# Patient Record
Sex: Female | Born: 1985 | Race: Black or African American | Hispanic: No | Marital: Single | Smoking: Never smoker
Health system: Southern US, Community
[De-identification: ages and names within clinical notes are randomized; demographics above are authoritative.]

## PROBLEM LIST (undated history)

## (undated) DIAGNOSIS — O24419 Gestational diabetes mellitus in pregnancy, unspecified control: Secondary | ICD-10-CM

## (undated) DIAGNOSIS — G43909 Migraine, unspecified, not intractable, without status migrainosus: Secondary | ICD-10-CM

## (undated) HISTORY — DX: Gestational diabetes mellitus in pregnancy, unspecified control: O24.419

## (undated) HISTORY — PX: WISDOM TOOTH EXTRACTION: SHX21

---
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
## 2007-01-07 ENCOUNTER — Ambulatory Visit (HOSPITAL_COMMUNITY): Admission: RE | Admit: 2007-01-07 | Discharge: 2007-01-07 | Payer: Self-pay | Admitting: Obstetrics & Gynecology

## 2007-05-02 ENCOUNTER — Inpatient Hospital Stay (HOSPITAL_COMMUNITY): Admission: AD | Admit: 2007-05-02 | Discharge: 2007-05-02 | Payer: Self-pay | Admitting: Obstetrics

## 2007-05-03 ENCOUNTER — Encounter: Payer: Self-pay | Admitting: Obstetrics & Gynecology

## 2007-05-03 ENCOUNTER — Inpatient Hospital Stay (HOSPITAL_COMMUNITY): Admission: AD | Admit: 2007-05-03 | Discharge: 2007-05-05 | Payer: Self-pay | Admitting: Obstetrics & Gynecology

## 2007-06-07 ENCOUNTER — Inpatient Hospital Stay (HOSPITAL_COMMUNITY): Admission: AD | Admit: 2007-06-07 | Discharge: 2007-06-07 | Payer: Self-pay | Admitting: Gynecology

## 2008-07-25 ENCOUNTER — Ambulatory Visit (HOSPITAL_COMMUNITY): Admission: RE | Admit: 2008-07-25 | Discharge: 2008-07-25 | Payer: Self-pay | Admitting: Obstetrics & Gynecology

## 2009-03-19 ENCOUNTER — Emergency Department: Payer: Self-pay | Admitting: Emergency Medicine

## 2010-01-27 LAB — HM HIV SCREENING LAB: HM HIV Screening: NEGATIVE

## 2010-06-05 ENCOUNTER — Emergency Department: Payer: Self-pay | Admitting: Emergency Medicine

## 2010-08-27 ENCOUNTER — Emergency Department: Payer: Self-pay | Admitting: Emergency Medicine

## 2010-08-28 ENCOUNTER — Observation Stay: Payer: Self-pay | Admitting: Internal Medicine

## 2010-11-16 ENCOUNTER — Encounter: Payer: Self-pay | Admitting: Obstetrics & Gynecology

## 2011-03-10 NOTE — H&P (Signed)
[name], [name]             ACCOUNT NO.:  [id number]   MEDICAL RECORD NO.:  [id number]          PATIENT TYPE:  INP   LOCATION:  9163                          FACILITY:  [facility]   PHYSICIAN:  Roseanna Rainbow, M.D.DATE OF BIRTH:  June 15, 1986   DATE OF ADMISSION:  05/03/2007  DATE OF DISCHARGE:                              HISTORY & PHYSICAL   CHIEF COMPLAINT:  This patient is a 25 year old gravida 1, para 0 with  an estimated date of confinement for May 31, 2007, with an  intrauterine pregnancy at 36 weeks, complaining of ruptured membranes.   HISTORY OF PRESENT ILLNESS:  Please see the above. Several hours prior  to presentation she reports leakage of clear fluid. She complains of  contractions as well.   ALLERGIES:  NO KNOWN DRUG ALLERGIES.   MEDICATIONS:  Please see the medication reconciliation form.   OB RISK FACTORS:  GBS asymptomatic bacteruria in March 2008.   PRENATAL SCREENINGS:  Chlamydia negative. Urine culture and sensitivity,  please see the above, negative test secure in April of 2008. One-hour  GTT 68, hepatitis B surface antigen negative. Hematocrit 38, hemoglobin  12.9, HIV nonreactive. Pap smear LSIL. Platelets 186,000. Blood type is  A positive, antibody screen negative. RPR nonreactive. Rubella immune.   Most recent ultrasound, 7/7, at 35 weeks 6 days no previa, cephalic  presentation, amniotic fluid index normal. BPP was 8/8.   PAST GYN HISTORY:  Noncontributory.   PAST MEDICAL HISTORY:  No significant history of medical diseases.   PAST SURGICAL HISTORY:  No previous surgery.   SOCIAL HISTORY:  She is a homemaker, single, does not give any  significant history of alcohol usage. Has no significant smoking  history. Denies illicit drug use.   FAMILY HISTORY:  She denies.   PHYSICAL EXAMINATION:  VITAL SIGNS: On physical exam vital signs are  stable, afebrile. Fetal heart tracing baseline 160, is reassuring.  GU: Tocodynamometer, irregular  uterine contractions. Sterile speculum  exam per the RN, she is fern and Nitrazine positive. Her cervix is 1 cm  dilated.   ASSESSMENT:  Intrauterine pregnancy at 36 weeks with early labor, group  B streptococcus  positive, borderline fetal sinus tachycardia.   PLAN:  1. Admission.  2. GBS prophylaxis.  3. Possible augmentation of labor with low-dose pitocin.  4. Monitor progress.      Roseanna Rainbow, M.D.  Electronically Signed     LAJ/MEDQ  D:  05/03/2007  T:  05/03/2007  Job:  [id number]

## 2011-08-10 ENCOUNTER — Emergency Department (HOSPITAL_COMMUNITY)
Admission: EM | Admit: 2011-08-10 | Discharge: 2011-08-10 | Disposition: A | Discharge status: Home / Self Care | Payer: No Typology Code available for payment source | Attending: Emergency Medicine | Admitting: Emergency Medicine

## 2011-08-10 ENCOUNTER — Emergency Department (HOSPITAL_COMMUNITY): Payer: No Typology Code available for payment source

## 2011-08-10 DIAGNOSIS — T171XXA Foreign body in nostril, initial encounter: Secondary | ICD-10-CM | POA: Insufficient documentation

## 2011-08-10 DIAGNOSIS — M25519 Pain in unspecified shoulder: Secondary | ICD-10-CM | POA: Insufficient documentation

## 2011-08-10 DIAGNOSIS — IMO0002 Reserved for concepts with insufficient information to code with codable children: Secondary | ICD-10-CM | POA: Insufficient documentation

## 2011-08-10 DIAGNOSIS — M542 Cervicalgia: Secondary | ICD-10-CM | POA: Insufficient documentation

## 2011-08-10 LAB — CBC
MCHC: 33.1
Platelets: 222
RDW: 12

## 2011-08-10 LAB — APTT: aPTT: 27

## 2011-08-11 LAB — CBC
HCT: 37.4
Hemoglobin: 12.4
MCV: 84.5
Platelets: 183
RBC: 3.98
RBC: 4.39
RDW: 12.4
WBC: 12.7 — ABNORMAL HIGH

## 2011-08-11 LAB — RPR: RPR Ser Ql: NONREACTIVE

## 2011-11-26 ENCOUNTER — Encounter (HOSPITAL_BASED_OUTPATIENT_CLINIC_OR_DEPARTMENT_OTHER): Payer: Self-pay

## 2011-11-26 ENCOUNTER — Emergency Department (HOSPITAL_BASED_OUTPATIENT_CLINIC_OR_DEPARTMENT_OTHER)
Admission: EM | Admit: 2011-11-26 | Discharge: 2011-11-26 | Disposition: A | Discharge status: Home / Self Care | Payer: Managed Care, Other (non HMO) | Attending: Emergency Medicine | Admitting: Emergency Medicine

## 2011-11-26 DIAGNOSIS — R1110 Vomiting, unspecified: Secondary | ICD-10-CM | POA: Insufficient documentation

## 2011-11-26 DIAGNOSIS — G43909 Migraine, unspecified, not intractable, without status migrainosus: Secondary | ICD-10-CM | POA: Insufficient documentation

## 2011-11-26 HISTORY — DX: Migraine, unspecified, not intractable, without status migrainosus: G43.909

## 2011-11-26 MED ORDER — METOCLOPRAMIDE HCL 5 MG/ML IJ SOLN
10.0000 mg | Freq: Once | INTRAMUSCULAR | Status: AC
  Administered 2011-11-26: 10 mg via INTRAMUSCULAR
  Filled 2011-11-26: qty 2

## 2011-11-26 MED ORDER — KETOROLAC TROMETHAMINE 60 MG/2ML IM SOLN
60.0000 mg | Freq: Once | INTRAMUSCULAR | Status: AC
  Administered 2011-11-26: 60 mg via INTRAMUSCULAR
  Filled 2011-11-26: qty 2

## 2011-11-26 MED ORDER — DIPHENHYDRAMINE HCL 50 MG/ML IJ SOLN
25.0000 mg | Freq: Once | INTRAMUSCULAR | Status: AC
  Administered 2011-11-26: 25 mg via INTRAMUSCULAR
  Filled 2011-11-26: qty 1

## 2011-11-26 NOTE — ED Notes (Signed)
Pt reports a migraine and photophobia since noon today.

## 2011-11-26 NOTE — ED Provider Notes (Signed)
History     CSN: [id number]  Arrival date & time 11/26/11  1857   First MD Initiated Contact with Patient 11/26/11 1903      Chief Complaint  Patient presents with  . Migraine    (Consider location/radiation/quality/duration/timing/severity/associated sxs/prior treatment) HPI Comments: Pt state that she has a history of migraines, but has not had them in a long time  Patient is a 26 y.o. female presenting with migraine. The history is provided by the patient. No language interpreter was used.  Migraine This is a recurrent problem. The current episode started today. The problem occurs constantly. The problem has been unchanged. Associated symptoms include nausea. Pertinent negatives include no fever, rash, sore throat, vomiting or weakness. Exacerbated by: light. She has tried nothing for the symptoms.    Past Medical History  Diagnosis Date  . Migraines     History reviewed. No pertinent past surgical history.  No family history on file.  History  Substance Use Topics  . Smoking status: Never Smoker   . Smokeless tobacco: Never Used  . Alcohol Use: Yes     occasional    OB History    Grav Para Term Preterm Abortions TAB SAB Ect Mult Living                  Review of Systems  Constitutional: Negative for fever.  HENT: Negative for sore throat.   Gastrointestinal: Positive for nausea. Negative for vomiting.  Skin: Negative for rash.  Neurological: Negative for weakness.  All other systems reviewed and are negative.    Allergies  Review of patient's allergies indicates no known allergies.  Home Medications   Current Outpatient Rx  Name Route Sig Dispense Refill  . IBUPROFEN 200 MG PO TABS Oral Take 400 mg by mouth once as needed. For headache    . LEVONORGESTREL 20 MCG/24HR IU IUD Intrauterine 1 each by Intrauterine route once. Inserted in 2008      BP 121/66  Pulse 72  Temp(Src) 98.9 F (37.2 C) (Oral)  Resp 16  Ht 5\' 6"  (1.676 m)  Wt 230 lb  (104.327 kg)  BMI 37.12 kg/m2  SpO2 100%  Physical Exam  Nursing note and vitals reviewed. Constitutional: She is oriented to person, place, and time. She appears well-developed and well-nourished.  HENT:  Head: Normocephalic and atraumatic.  Eyes: Conjunctivae and EOM are normal.  Neck: Neck supple.  Cardiovascular: Normal rate and regular rhythm.   Pulmonary/Chest: Effort normal.  Musculoskeletal: Normal range of motion.  Neurological: She is alert and oriented to person, place, and time.  Skin: Skin is warm and dry.  Psychiatric: She has a normal mood and affect.    ED Course  Procedures (including critical care time)  Labs Reviewed - No data to display No results found.   1. Migraine       MDM  Pt is feeling better at this time:pt is okay to go home        Teressa Lower, NP 11/26/11 2109

## 2011-11-26 NOTE — ED Provider Notes (Signed)
26 year old female had onset today of a severe, throbbing headache typical of migraines. There is associated nausea and vomiting and photophobia. Her exam is remarkable for photophobia but neck is supple and she has no neurologic deficits. She got significant relief with a headache cocktail of ketorolac, Benadryl, and metoclopramide.  Dione Booze, MD 11/26/11 Corky Crafts

## 2011-11-26 NOTE — ED Provider Notes (Signed)
Medical screening examination/treatment/procedure(s) were conducted as a shared visit with non-physician practitioner(s) and myself.  I personally evaluated the patient during the encounter   Dione Booze, MD 11/26/11 2355

## 2011-11-26 NOTE — ED Notes (Signed)
On phone.  Status same

## 2011-11-26 NOTE — ED Notes (Signed)
Pain level 1/10.  States she is ready to go

## 2011-11-26 NOTE — ED Notes (Signed)
States feels much better.  Pain level 6/10

## 2011-11-26 NOTE — ED Notes (Signed)
Secondary Assessment-  Pt reports a frontal headache that started today at 12:00, associated with nausea and photophobia.  Denies head injury or recent illness.

## 2013-12-29 ENCOUNTER — Encounter (HOSPITAL_COMMUNITY): Payer: Self-pay | Admitting: Emergency Medicine

## 2013-12-29 ENCOUNTER — Emergency Department (INDEPENDENT_AMBULATORY_CARE_PROVIDER_SITE_OTHER)
Admission: EM | Admit: 2013-12-29 | Discharge: 2013-12-29 | Disposition: A | Discharge status: Home / Self Care | Payer: PRIVATE HEALTH INSURANCE | Source: Home / Self Care

## 2013-12-29 DIAGNOSIS — IMO0002 Reserved for concepts with insufficient information to code with codable children: Secondary | ICD-10-CM

## 2013-12-29 DIAGNOSIS — S46912A Strain of unspecified muscle, fascia and tendon at shoulder and upper arm level, left arm, initial encounter: Secondary | ICD-10-CM

## 2013-12-29 MED ORDER — DICLOFENAC POTASSIUM 50 MG PO TABS: 50.0000 mg | ORAL_TABLET | Freq: Three times a day (TID) | ORAL | Status: DC
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
## 2013-12-29 NOTE — ED Provider Notes (Signed)
Medical screening examination/treatment/procedure(s) were performed by resident physician or non-physician practitioner and as supervising physician I was immediately available for consultation/collaboration.   KINDL,JAMES DOUGLAS MD.   James D Kindl, MD 12/29/13 1229 

## 2013-12-29 NOTE — Discharge Instructions (Signed)
Joint Sprain A sprain is a tear or stretch in the ligaments that hold a joint together. Severe sprains may need as long as 3-6 weeks of immobilization and/or exercises to heal completely. Sprained joints should be rested and protected. If not, they can become unstable and prone to re-injury. Proper treatment can reduce your pain, shorten the period of disability, and reduce the risk of repeated injuries. TREATMENT   Rest and elevate the injured joint to reduce pain and swelling.  Apply ice packs to the injury for 20-30 minutes every 2-3 hours for the next 2-3 days.  Keep the injury wrapped in a compression bandage or splint as long as the joint is painful or as instructed by your caregiver.  Do not use the injured joint until it is completely healed to prevent re-injury and chronic instability. Follow the instructions of your caregiver.  Long-term sprain management may require exercises and/or treatment by a physical therapist. Taping or special braces may help stabilize the joint until it is completely better. SEEK MEDICAL CARE IF:   You develop increased pain or swelling of the joint.  You develop increasing redness and warmth of the joint.  You develop a fever.  It becomes stiff.  Your hand or foot gets cold or numb. Document Released: 11/19/2004 Document Revised: 01/04/2012 Document Reviewed: 10/29/2008 [facility] Patient Information 2014 [place], [place].  Shoulder Sprain A shoulder sprain is the result of damage to the tough, fiber-like tissues (ligaments) that help hold your shoulder in place. The ligaments may be stretched or torn. Besides the main shoulder joint (the ball and socket), there are several smaller joints that connect the bones in this area. A sprain usually involves one of those joints. Most often it is the acromioclavicular (or AC) joint. That is the joint that connects the collarbone (clavicle) and the shoulder blade (scapula) at the top point of the shoulder blade  (acromion). A shoulder sprain is a mild form of what is called a shoulder separation. Recovering from a shoulder sprain may take some time. For some, pain lingers for several months. Most people recover without long term problems. CAUSES   A shoulder sprain is usually caused by some kind of trauma. This might be:  Falling on an outstretched arm.  Being hit hard on the shoulder.  Twisting the arm.  Shoulder sprains are more likely to occur in people who:  Play sports.  Have balance or coordination problems. SYMPTOMS   Pain when you move your shoulder.  Limited ability to move the shoulder.  Swelling and tenderness on top of the shoulder.  Redness or warmth in the shoulder.  Bruising.  A change in the shape of the shoulder. DIAGNOSIS  Your healthcare provider may:  Ask about your symptoms.  Ask about recent activity that might have caused those symptoms.  Examine your shoulder. You may be asked to do simple exercises to test movement. The other shoulder will be examined for comparison.  Order some tests that provide a look inside the body. They can show the extent of the injury. The tests could include:  X-rays.  CT (computed tomography) scan.  MRI (magnetic resonance imaging) scan. RISKS AND COMPLICATIONS  Loss of full shoulder motion.  Ongoing shoulder pain. TREATMENT  How long it takes to recover from a shoulder sprain depends on how severe it was. Treatment options may include:  Rest. You should not use the arm or shoulder until it heals.  Ice. For 2 or 3 days after the injury, put  an ice pack on the shoulder up to 4 times a day. It should stay on for 15 to 20 minutes each time. Wrap the ice in a towel so it does not touch your skin.  Over-the-counter medicine to relieve pain.  A sling or brace. This will keep the arm still while the shoulder is healing.  Physical therapy or rehabilitation exercises. These will help you regain strength and motion. Ask  your healthcare provider when it is OK to begin these exercises.  Surgery. The need for surgery is rare with a sprained shoulder, but some people may need surgery to keep the joint in place and reduce pain. HOME CARE INSTRUCTIONS   Ask your healthcare provider about what you should and should not do while your shoulder heals.  Make sure you know how to apply ice to the correct area of your shoulder.  Talk with your healthcare provider about which medications should be used for pain and swelling.  If rehabilitation therapy will be needed, ask your healthcare provider to refer you to a therapist. If it is not recommended, then ask about at-home exercises. Find out when exercise should begin. SEEK MEDICAL CARE IF:  Your pain, swelling, or redness at the joint increases. SEEK IMMEDIATE MEDICAL CARE IF:   You have a fever.  You cannot move your arm or shoulder. Document Released: 02/28/2009 Document Revised: 01/04/2012 Document Reviewed: 02/28/2009 [facility] Patient Information 2014 [place], [place].
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
## 2014-03-07 ENCOUNTER — Ambulatory Visit: Payer: Self-pay | Admitting: Obstetrics & Gynecology

## 2014-07-06 ENCOUNTER — Other Ambulatory Visit: Payer: Self-pay | Admitting: Obstetrics & Gynecology

## 2014-07-06 ENCOUNTER — Ambulatory Visit (INDEPENDENT_AMBULATORY_CARE_PROVIDER_SITE_OTHER): Payer: PRIVATE HEALTH INSURANCE | Admitting: Obstetrics & Gynecology

## 2014-07-06 VITALS — BP 120/78 | HR 98 | Temp 98.7°F

## 2014-07-06 DIAGNOSIS — Z30431 Encounter for routine checking of intrauterine contraceptive device: Secondary | ICD-10-CM

## 2014-07-06 DIAGNOSIS — Z3202 Encounter for pregnancy test, result negative: Secondary | ICD-10-CM

## 2014-07-06 DIAGNOSIS — T8389XA Other specified complication of genitourinary prosthetic devices, implants and grafts, initial encounter: Secondary | ICD-10-CM

## 2014-07-06 LAB — POCT URINE PREGNANCY: PREG TEST UR: NEGATIVE

## 2014-07-07 LAB — WET PREP BY MOLECULAR PROBE
CANDIDA SPECIES: NEGATIVE
GARDNERELLA VAGINALIS: POSITIVE — AB
TRICHOMONAS VAG: NEGATIVE

## 2014-07-07 LAB — GC/CHLAMYDIA PROBE AMP
CT Probe RNA: NEGATIVE
GC Probe RNA: NEGATIVE

## 2014-07-08 ENCOUNTER — Encounter: Payer: Self-pay | Admitting: Obstetrics & Gynecology

## 2014-07-08 NOTE — Progress Notes (Signed)
Patient ID: [name], female   DOB: 1986/07/21, 28 y.o.   MRN: [id number]  Chief Complaint  Patient presents with  . Follow-up    check iud    HPI [name] is a 28 y.o. female.  Pt unable to feel IUD strings.    HPI  Past Medical History  Diagnosis Date  . Migraines     No past surgical history on file.  No family history on file.  Social History History  Substance Use Topics  . Smoking status: Never Smoker   . Smokeless tobacco: Never Used  . Alcohol Use: Yes     Comment: occasional    No Known Allergies  Current Outpatient Prescriptions  Medication Sig Dispense Refill  . ibuprofen (ADVIL,MOTRIN) 200 MG tablet Take 400 mg by mouth once as needed. For headache      . levonorgestrel (MIRENA) 20 MCG/24HR IUD 1 each by Intrauterine route once. Inserted in 2008      . diclofenac (CATAFLAM) 50 MG tablet Take 1 tablet (50 mg total) by mouth 3 (three) times daily. Prn pain. Take with food.  21 tablet  0   No current facility-administered medications for this visit.    Review of Systems Review of Systems Constitutional: negative for fatigue and weight loss Respiratory: negative for cough and wheezing Cardiovascular: negative for chest pain, fatigue and palpitations Gastrointestinal: negative for abdominal pain and change in bowel habits Genitourinary:negative for abnormal vaginal discharge Integument/breast: negative for nipple discharge Musculoskeletal:negative for myalgias Neurological: negative for gait problems and tremors Behavioral/Psych: negative for abusive relationship, depression Endocrine: negative for temperature intolerance     Blood pressure 120/78, pulse 98, temperature 98.7 F (37.1 C).  Physical Exam Physical Exam General:   alert  Skin:   no rash or abnormalities  Lungs:   clear to auscultation bilaterally  Heart:   regular rate and rhythm, S1, S2 normal, no murmur, click, rub or gallop  Abdomen:  normal findings: no  organomegaly, soft, non-tender and no hernia  Pelvis:  External genitalia: normal general appearance Urinary system: urethral meatus normal and bladder without fullness, nontender Vaginal: normal without tenderness, induration or masses Cervix: normal appearance; IUD strings Adnexa: normal bimanual exam Uterus: anteverted and non-tender, normal size    U/S w/3-D imaging--IUD appropriately positioned  Data Reviewed UPT  Assessment    IUD in appropriate position     Plan    Orders Placed This Encounter  Procedures  . WET PREP BY MOLECULAR PROBE  . GC/Chlamydia Probe Amp  . US Transvaginal Non-OB    Standing Status: Future     Number of Occurrences:      Standing Expiration Date: 09/06/2015    Scheduling Instructions:     Do not schedule.    Order Specific Question:  Reason for Exam (SYMPTOM  OR DIAGNOSIS REQUIRED)    Answer:  IUD Placement    Order Specific Question:  Preferred imaging location?    Answer:  Internal  . POCT urine pregnancy    Follow up as needed.         JACKSON-MOORE,Nolyn Swab A 07/08/2014, 12:29 PM

## 2014-07-10 ENCOUNTER — Other Ambulatory Visit: Payer: PRIVATE HEALTH INSURANCE

## 2014-08-01 ENCOUNTER — Other Ambulatory Visit (INDEPENDENT_AMBULATORY_CARE_PROVIDER_SITE_OTHER): Payer: PRIVATE HEALTH INSURANCE

## 2014-08-01 DIAGNOSIS — T8389XA Other specified complication of genitourinary prosthetic devices, implants and grafts, initial encounter: Secondary | ICD-10-CM

## 2014-08-03 ENCOUNTER — Other Ambulatory Visit: Payer: Self-pay | Admitting: *Deleted

## 2014-08-03 DIAGNOSIS — B9689 Other specified bacterial agents as the cause of diseases classified elsewhere: Secondary | ICD-10-CM

## 2014-08-03 DIAGNOSIS — N760 Acute vaginitis: Principal | ICD-10-CM

## 2014-08-03 MED ORDER — METRONIDAZOLE 500 MG PO TABS: 500.0000 mg | ORAL_TABLET | Freq: Two times a day (BID) | ORAL | Status: DC

## 2014-10-22 ENCOUNTER — Encounter: Payer: Self-pay | Admitting: *Deleted

## 2014-10-23 ENCOUNTER — Encounter: Payer: Self-pay | Admitting: Obstetrics & Gynecology

## 2014-11-12 ENCOUNTER — Emergency Department (HOSPITAL_COMMUNITY)
Admission: EM | Admit: 2014-11-12 | Discharge: 2014-11-12 | Disposition: A | Discharge status: Home / Self Care | Payer: PRIVATE HEALTH INSURANCE | Attending: Emergency Medicine | Admitting: Emergency Medicine

## 2014-11-12 ENCOUNTER — Encounter (HOSPITAL_COMMUNITY): Payer: Self-pay | Admitting: Emergency Medicine

## 2014-11-12 DIAGNOSIS — Z8679 Personal history of other diseases of the circulatory system: Secondary | ICD-10-CM | POA: Diagnosis not present

## 2014-11-12 DIAGNOSIS — Z792 Long term (current) use of antibiotics: Secondary | ICD-10-CM | POA: Insufficient documentation

## 2014-11-12 DIAGNOSIS — Z791 Long term (current) use of non-steroidal anti-inflammatories (NSAID): Secondary | ICD-10-CM | POA: Diagnosis not present

## 2014-11-12 DIAGNOSIS — R110 Nausea: Secondary | ICD-10-CM | POA: Diagnosis not present

## 2014-11-12 DIAGNOSIS — J029 Acute pharyngitis, unspecified: Secondary | ICD-10-CM | POA: Diagnosis present

## 2014-11-12 DIAGNOSIS — J020 Streptococcal pharyngitis: Secondary | ICD-10-CM | POA: Insufficient documentation

## 2014-11-12 LAB — CBC WITH DIFFERENTIAL/PLATELET
BASOS ABS: 0 10*3/uL (ref 0.0–0.1)
Basophils Relative: 0 % (ref 0–1)
Eosinophils Absolute: 0.1 10*3/uL (ref 0.0–0.7)
Eosinophils Relative: 1 % (ref 0–5)
HCT: 40.3 % (ref 36.0–46.0)
HEMOGLOBIN: 13.8 g/dL (ref 12.0–15.0)
LYMPHS ABS: 1.6 10*3/uL (ref 0.7–4.0)
LYMPHS PCT: 12 % (ref 12–46)
MCH: 28 pg (ref 26.0–34.0)
MCHC: 34.2 g/dL (ref 30.0–36.0)
MCV: 81.9 fL (ref 78.0–100.0)
MONO ABS: 1.2 10*3/uL — AB (ref 0.1–1.0)
MONOS PCT: 9 % (ref 3–12)
Neutro Abs: 10.5 10*3/uL — ABNORMAL HIGH (ref 1.7–7.7)
Neutrophils Relative %: 78 % — ABNORMAL HIGH (ref 43–77)
PLATELETS: 199 10*3/uL (ref 150–400)
RBC: 4.92 MIL/uL (ref 3.87–5.11)
RDW: 12.7 % (ref 11.5–15.5)
WBC: 13.4 10*3/uL — ABNORMAL HIGH (ref 4.0–10.5)

## 2014-11-12 LAB — COMPREHENSIVE METABOLIC PANEL
ALK PHOS: 47 U/L (ref 39–117)
ALT: 20 U/L (ref 0–35)
ANION GAP: 6 (ref 5–15)
AST: 15 U/L (ref 0–37)
Albumin: 4 g/dL (ref 3.5–5.2)
BILIRUBIN TOTAL: 0.5 mg/dL (ref 0.3–1.2)
BUN: 7 mg/dL (ref 6–23)
CO2: 24 mmol/L (ref 19–32)
CREATININE: 0.59 mg/dL (ref 0.50–1.10)
Calcium: 8.3 mg/dL — ABNORMAL LOW (ref 8.4–10.5)
Chloride: 104 mEq/L (ref 96–112)
GFR calc non Af Amer: >90 mL/min (ref >90)
Glucose, Bld: 118 mg/dL — ABNORMAL HIGH (ref 70–99)
POTASSIUM: 3.8 mmol/L (ref 3.5–5.1)
SODIUM: 134 mmol/L — AB (ref 135–145)
Total Protein: 7.8 g/dL (ref 6.0–8.3)

## 2014-11-12 LAB — RAPID STREP SCREEN (MED CTR MEBANE ONLY): Streptococcus, Group A Screen (Direct): POSITIVE — AB

## 2014-11-12 MED ORDER — PENICILLIN V POTASSIUM 500 MG PO TABS: 500.0000 mg | ORAL_TABLET | Freq: Two times a day (BID) | ORAL | Status: AC

## 2014-11-12 NOTE — ED Provider Notes (Signed)
CSN: [id number]     Arrival date & time 11/12/14  0435 History   First MD Initiated Contact with Patient 11/12/14 [phone]     Chief Complaint  Patient presents with  . Sore Throat  . Headache     (Consider location/radiation/quality/duration/timing/severity/associated sxs/prior Treatment) HPI Pt is a 29yo female presenting to ED with c/o diffuse headache, mild, gradual in onset, with associated sore throat, body aches, mild intermittent dry cough, nausea, and hot and cold chills for 2-3 days.  Sore throat is aching, burning and sore, worse with swallowing, 9/10 at worst. Repots her tonsils looking swollen.  She has been taking tylenol flu with minimal relief. No sick contacts or recent travel. No hx of asthma. Denies difficulty breathing or CP.  No other significant PMH.  Past Medical History  Diagnosis Date  . Migraines    History reviewed. No pertinent past surgical history. History reviewed. No pertinent family history. History  Substance Use Topics  . Smoking status: Never Smoker   . Smokeless tobacco: Never Used  . Alcohol Use: Yes     Comment: occasional   OB History    No data available     Review of Systems  Constitutional: Positive for fever ( subjective) and chills.  HENT: Positive for sore throat. Negative for congestion, ear pain, trouble swallowing and voice change.   Respiratory: Positive for cough. Negative for shortness of breath.   Gastrointestinal: Positive for nausea. Negative for vomiting, abdominal pain and diarrhea.  Musculoskeletal: Positive for myalgias. Negative for back pain.  Neurological: Positive for headaches. Negative for dizziness and light-headedness.  All other systems reviewed and are negative.     Allergies  Review of patient's allergies indicates no known allergies.  Home Medications   Prior to Admission medications   Medication Sig Start Date End Date Taking? Authorizing Provider  ibuprofen (ADVIL,MOTRIN) 200 MG tablet Take 400 mg  by mouth once as needed. For headache   Yes Historical Provider, MD  levonorgestrel (MIRENA) 20 MCG/24HR IUD 1 each by Intrauterine route once. Inserted in 2014   Yes Historical Provider, MD  Pseudoephedrine-APAP-DM (TYLENOL COLD/FLU SEVERE DAY PO) Take 30 mLs by mouth every 6 (six) hours as needed (cough).   Yes Historical Provider, MD  diclofenac (CATAFLAM) 50 MG tablet Take 1 tablet (50 mg total) by mouth 3 (three) times daily. Prn pain. Take with food. Patient not taking: Reported on 11/12/2014 12/29/13   Hayden Rasmussenavid Mabe, NP  metroNIDAZOLE (FLAGYL) 500 MG tablet Take 1 tablet (500 mg total) by mouth 2 (two) times daily. Patient not taking: Reported on 11/12/2014 08/03/14   Antionette CharLisa Jackson-Moore, MD  penicillin v potassium (VEETID) 500 MG tablet Take 1 tablet (500 mg total) by mouth 2 (two) times daily. For 10 days 11/12/14 11/19/14  Junius FinnerErin O'Malley, PA-C   BP 135/72 mmHg  Pulse 99  Temp(Src) 98.9 F (37.2 C) (Oral)  Resp 20  Ht 5\' 4"  (1.626 m)  Wt 240 lb (108.863 kg)  BMI 41.18 kg/m2  SpO2 98% Physical Exam  Constitutional: She appears well-developed and well-nourished. No distress.  HENT:  Head: Normocephalic and atraumatic.  Right Ear: Hearing, tympanic membrane, external ear and ear canal normal.  Left Ear: Hearing, tympanic membrane, external ear and ear canal normal.  Nose: Nose normal.  Mouth/Throat: Uvula is midline and mucous membranes are normal. Oropharyngeal exudate, posterior oropharyngeal edema and posterior oropharyngeal erythema present. No tonsillar abscesses.  Eyes: Conjunctivae are normal. No scleral icterus.  Neck: Normal range of motion. Neck  supple.  Cardiovascular: Normal rate, regular rhythm and normal heart sounds.   Pulmonary/Chest: Effort normal and breath sounds normal. No respiratory distress. She has no wheezes. She has no rales. She exhibits no tenderness.  Abdominal: Soft. Bowel sounds are normal. She exhibits no distension and no mass. There is no tenderness. There is  no rebound and no guarding.  Musculoskeletal: Normal range of motion.  Neurological: She is alert.  Skin: Skin is warm and dry. She is not diaphoretic.  Nursing note and vitals reviewed.   ED Course  Procedures (including critical care time) Labs Review Labs Reviewed  RAPID STREP SCREEN - Abnormal; Notable for the following:    Streptococcus, Group A Screen (Direct) POSITIVE (*)    All other components within normal limits  CBC WITH DIFFERENTIAL - Abnormal; Notable for the following:    WBC 13.4 (*)    Neutrophils Relative % 78 (*)    Neutro Abs 10.5 (*)    Monocytes Absolute 1.2 (*)    All other components within normal limits  COMPREHENSIVE METABOLIC PANEL - Abnormal; Notable for the following:    Sodium 134 (*)    Glucose, Bld 118 (*)    Calcium 8.3 (*)    All other components within normal limits    Imaging Review No results found.   EKG Interpretation None      MDM   Final diagnoses:  Strep pharyngitis   Pt positive for strep pharyngitis. No respiratory distress. No tonsillar abscess. Pt able to keep down fluids. Pt stable for discharge home. Rx: PCN. Home care instructions provided. Advised to f/u with PCP if not improving. Return precautions provided. Pt verbalized understanding and agreement with tx plan.     Junius Finner, PA-C 11/12/14 8119  Loren Racer, MD 11/15/14 [phone]

## 2014-11-12 NOTE — Discharge Instructions (Signed)
You may take 400-600mg Ibuprofen (Motrin) every 6-8 hours for fever and pain  °Alternate with Tylenol  °You may take 500-1000mg Tylenol every 4-6 hours as needed for fever and pain  °Follow-up with your primary care provider next week for recheck of symptoms if not improving.  °Be sure to drink plenty of fluids and rest, at least 8hrs of sleep a night, preferably more while you are sick. °Return to the ED if you cannot keep down fluids/signs of dehydration, fever not reducing with Tylenol, difficulty breathing/wheezing, stiff neck, worsening condition, or other concerns (see below)  ° °

## 2014-11-12 NOTE — ED Notes (Signed)
Pt arrived to the ED with a complaint of a sore throat, a cough, generalized body aches and chills and hot flashes.  Pt states symptoms have been present for three days.  Pt states it hurts to swallow.  Back of throat visibly swollen.  Pt has not had her tonsils out.

## 2015-01-09 ENCOUNTER — Ambulatory Visit: Payer: PRIVATE HEALTH INSURANCE | Admitting: Obstetrics & Gynecology

## 2016-10-11 ENCOUNTER — Encounter (HOSPITAL_COMMUNITY): Payer: Self-pay | Admitting: *Deleted

## 2016-10-11 ENCOUNTER — Emergency Department (HOSPITAL_COMMUNITY)
Admission: EM | Admit: 2016-10-11 | Discharge: 2016-10-11 | Disposition: A | Discharge status: Home / Self Care | Payer: Medicaid Other | Attending: Emergency Medicine | Admitting: Emergency Medicine

## 2016-10-11 DIAGNOSIS — R51 Headache: Secondary | ICD-10-CM | POA: Diagnosis not present

## 2016-10-11 DIAGNOSIS — Z79899 Other long term (current) drug therapy: Secondary | ICD-10-CM | POA: Diagnosis not present

## 2016-10-11 DIAGNOSIS — Z7982 Long term (current) use of aspirin: Secondary | ICD-10-CM | POA: Diagnosis not present

## 2016-10-11 DIAGNOSIS — R519 Headache, unspecified: Secondary | ICD-10-CM

## 2016-10-11 MED ORDER — DIPHENHYDRAMINE HCL 50 MG/ML IJ SOLN
25.0000 mg | Freq: Once | INTRAMUSCULAR | Status: AC
  Administered 2016-10-11: 25 mg via INTRAVENOUS
  Filled 2016-10-11: qty 1

## 2016-10-11 MED ORDER — SODIUM CHLORIDE 0.9 % IV BOLUS (SEPSIS)
500.0000 mL | Freq: Once | INTRAVENOUS | Status: AC
  Administered 2016-10-11: 500 mL via INTRAVENOUS

## 2016-10-11 MED ORDER — METOCLOPRAMIDE HCL 5 MG/ML IJ SOLN
10.0000 mg | Freq: Once | INTRAMUSCULAR | Status: AC
  Administered 2016-10-11: 10 mg via INTRAVENOUS
  Filled 2016-10-11: qty 2

## 2016-10-11 MED ORDER — DEXAMETHASONE SODIUM PHOSPHATE 10 MG/ML IJ SOLN
10.0000 mg | Freq: Once | INTRAMUSCULAR | Status: AC
  Administered 2016-10-11: 10 mg via INTRAVENOUS
  Filled 2016-10-11: qty 1

## 2016-10-11 MED ORDER — KETOROLAC TROMETHAMINE 15 MG/ML IJ SOLN
15.0000 mg | Freq: Once | INTRAMUSCULAR | Status: AC
  Administered 2016-10-11: 15 mg via INTRAVENOUS
  Filled 2016-10-11: qty 1

## 2016-10-11 NOTE — ED Notes (Signed)
Bed: [id number] Expected date:  Expected time:  Means of arrival:  Comments: 30 yo F  Migraine

## 2016-10-11 NOTE — ED Provider Notes (Signed)
[facility]-EMERGENCY DEPT Provider Note   CSN: [id number] Arrival date & time: 10/11/16  1930     History   Chief Complaint Chief Complaint  Patient presents with  . Migraine    HPI [name] is a 30 y.o. female.  The history is provided by the patient. No language interpreter was used.  Migraine    [name] is a 30 y.o. female who presents to the Emergency Department complaining of headache.  She has a history of frequent migraine headaches since the age of 89. She reports headache that started around 2 PM today that is similar to her prior migraine headaches. She reports a bitemporal throbbing head pain with associated nausea, vomiting, photophobia. No recent illnesses or injuries. No one else in the home is sick. She has numbness in her hands and feet and that is typical for her migraines. She has been seen by neurology for these headaches and had outpatient imaging several years ago. She took an Excedrin Migraine today with no improvement in her symptoms.  Past Medical History:  Diagnosis Date  . Migraines     There are no active problems to display for this patient.   History reviewed. No pertinent surgical history.  OB History    No data available       Home Medications    Prior to Admission medications   Medication Sig Start Date End Date Taking? Authorizing Provider  aspirin-acetaminophen-caffeine (EXCEDRIN MIGRAINE) [phone] MG tablet Take 2 tablets by mouth every 6 (six) hours as needed for headache or migraine.   Yes Historical Provider, MD  levonorgestrel (MIRENA) 20 MCG/24HR IUD 1 each by Intrauterine route once. Inserted in 2014   Yes Historical Provider, MD  ibuprofen (ADVIL,MOTRIN) 200 MG tablet Take 400 mg by mouth once as needed. For headache    Historical Provider, MD    Family History No family history on file.  Social History Social History  Substance Use Topics  . Smoking status: Never Smoker  . Smokeless tobacco: Never Used    . Alcohol use Yes     Comment: occasional     Allergies   Patient has no known allergies.   Review of Systems Review of Systems  All other systems reviewed and are negative.    Physical Exam Updated Vital Signs BP 119/76 (BP Location: Left Arm)   Pulse 75   Temp 97.7 F (36.5 C) (Oral)   Resp 16   Ht 5\' 5"  (1.651 m)   Wt 215 lb (97.5 kg)   SpO2 100%   BMI 35.78 kg/m   Physical Exam  Constitutional: She is oriented to person, place, and time. She appears well-developed and well-nourished.  HENT:  Head: Normocephalic and atraumatic.  Eyes: EOM are normal. Pupils are equal, round, and reactive to light.  Neck: Neck supple.  Cardiovascular: Normal rate and regular rhythm.   No murmur heard. Pulmonary/Chest: Effort normal and breath sounds normal. No respiratory distress.  Abdominal: Soft. There is no tenderness. There is no rebound and no guarding.  Musculoskeletal: She exhibits no edema or tenderness.  Neurological: She is alert and oriented to person, place, and time. No cranial nerve deficit.  5/5 strength in all four extremities.   Skin: Skin is warm and dry.  Psychiatric: She has a normal mood and affect. Her behavior is normal.  Nursing note and vitals reviewed.    ED Treatments / Results  Labs (all labs ordered are listed, but only abnormal results are displayed) Labs  Reviewed - No data to display  EKG  EKG Interpretation None       Radiology No results found.  Procedures Procedures (including critical care time)  Medications Ordered in ED Medications  metoCLOPramide (REGLAN) injection 10 mg (10 mg Intravenous Given 10/11/16 2010)  diphenhydrAMINE (BENADRYL) injection 25 mg (25 mg Intravenous Given 10/11/16 2007)  dexamethasone (DECADRON) injection 10 mg (10 mg Intravenous Given 10/11/16 2012)  ketorolac (TORADOL) 15 MG/ML injection 15 mg (15 mg Intravenous Given 10/11/16 2009)  sodium chloride 0.9 % bolus 500 mL (500 mLs Intravenous New  Bag/Given 10/11/16 2007)     Initial Impression / Assessment and Plan / ED Course  I have reviewed the triage vital signs and the nursing notes.  Pertinent labs & imaging results that were available during my care of the patient were reviewed by me and considered in my medical decision making (see chart for details).  Clinical Course     Patient with history of migraine headache here with symptoms similar to her prior headaches. Following treatment in the emergency department she is feeling significantly improved. Presentation is not consistent with meningitis, subarachnoid hemorrhage, symptomatic aneurysm, dural sinus thrombosis. Discussed with patient for headache, outpatient follow-up and return precautions.   Final Clinical Impressions(s) / ED Diagnoses   Final diagnoses:  Bad headache    New Prescriptions New Prescriptions   No medications on file     Tilden FossaElizabeth Nash Bolls, MD 10/11/16 2103
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
## 2016-12-04 ENCOUNTER — Encounter: Payer: Self-pay | Admitting: Emergency Medicine

## 2016-12-04 ENCOUNTER — Emergency Department: Payer: Medicaid Other

## 2016-12-04 ENCOUNTER — Emergency Department
Admission: EM | Admit: 2016-12-04 | Discharge: 2016-12-04 | Disposition: A | Discharge status: Home / Self Care | Payer: Medicaid Other | Attending: Emergency Medicine | Admitting: Emergency Medicine

## 2016-12-04 DIAGNOSIS — Z79899 Other long term (current) drug therapy: Secondary | ICD-10-CM | POA: Insufficient documentation

## 2016-12-04 DIAGNOSIS — Z791 Long term (current) use of non-steroidal anti-inflammatories (NSAID): Secondary | ICD-10-CM | POA: Diagnosis not present

## 2016-12-04 DIAGNOSIS — J111 Influenza due to unidentified influenza virus with other respiratory manifestations: Secondary | ICD-10-CM | POA: Diagnosis not present

## 2016-12-04 DIAGNOSIS — R0981 Nasal congestion: Secondary | ICD-10-CM | POA: Diagnosis present

## 2016-12-04 HISTORY — DX: Migraine, unspecified, not intractable, without status migrainosus: G43.909

## 2016-12-04 MED ORDER — IBUPROFEN 800 MG PO TABS
800.0000 mg | ORAL_TABLET | Freq: Once | ORAL | Status: AC
  Administered 2016-12-04: 800 mg via ORAL
  Filled 2016-12-04: qty 1

## 2016-12-04 MED ORDER — OSELTAMIVIR PHOSPHATE 75 MG PO CAPS: 75.0000 mg | ORAL_CAPSULE | Freq: Two times a day (BID) | ORAL | 0 refills | Status: AC

## 2016-12-04 MED ORDER — HYDROCOD POLST-CPM POLST ER 10-8 MG/5ML PO SUER: 5.0000 mL | Freq: Two times a day (BID) | ORAL | 0 refills | Status: DC | PRN

## 2016-12-04 MED ORDER — BENZONATATE 100 MG PO CAPS
200.0000 mg | ORAL_CAPSULE | Freq: Once | ORAL | Status: AC
  Administered 2016-12-04: 200 mg via ORAL
  Filled 2016-12-04: qty 2

## 2016-12-04 NOTE — ED Provider Notes (Signed)
[facility] Emergency Department Provider Note    First MD Initiated Contact with Patient 12/04/16 [phone]     (approximate)  I have reviewed the triage vital signs and the nursing notes.   HISTORY  Chief Complaint Generalized Body Aches; Cough; Nasal Congestion; and Headache    HPI [name] is a 31 y.o. female presents with three-day history of cough congestion and generalized body aches and subjective fevers at home temperature on arrival 99.7. Patient unclear if any known sick contact. Patient also admits to 2 episodes of emesis today. In addition patient admits to a nonproductive cough.   Past Medical History:  Diagnosis Date  . Migraine   . Migraines     There are no active problems to display for this patient.   History reviewed. No pertinent surgical history.  Prior to Admission medications   Medication Sig Start Date End Date Taking? Authorizing Provider  aspirin-acetaminophen-caffeine (EXCEDRIN MIGRAINE) [phone] MG tablet Take 2 tablets by mouth every 6 (six) hours as needed for headache or migraine.    Historical Provider, MD  ibuprofen (ADVIL,MOTRIN) 200 MG tablet Take 400 mg by mouth once as needed. For headache    Historical Provider, MD  levonorgestrel (MIRENA) 20 MCG/24HR IUD 1 each by Intrauterine route once. Inserted in 2014    Historical Provider, MD    Allergies Patient has no known allergies.  No family history on file.  Social History Social History  Substance Use Topics  . Smoking status: Never Smoker  . Smokeless tobacco: Never Used  . Alcohol use Yes     Comment: occasional    Review of Systems Constitutional: Positive for fever/chills Eyes: No visual changes. ENT: No sore throat. Cardiovascular: Denies chest pain. Respiratory: Denies shortness of breath. Positive for cough Gastrointestinal: No abdominal pain.  No nausea, no vomiting.  No diarrhea.  No constipation. Genitourinary: Negative for  dysuria. Musculoskeletal: Negative for back pain.Positive for generalized muscle aches Skin: Negative for rash. Neurological: Negative for headaches, focal weakness or numbness.  10-point ROS otherwise negative.  ____________________________________________   PHYSICAL EXAM:  VITAL SIGNS: ED Triage Vitals  Enc Vitals Group     BP 12/04/16 0118 121/75     Pulse Rate 12/04/16 0118 (!) 110     Resp 12/04/16 0118 18     Temp 12/04/16 0118 99.7 F (37.6 C)     Temp Source 12/04/16 0118 Oral     SpO2 12/04/16 0118 100 %     Weight 12/04/16 0111 215 lb (97.5 kg)     Height 12/04/16 0111 5\' 5"  (1.651 m)     Head Circumference --      Peak Flow --      Pain Score 12/04/16 0111 8     Pain Loc --      Pain Edu? --      Excl. in GC? --     Constitutional: Alert and oriented. Well appearing and in no acute distress. Eyes: Conjunctivae are normal. PERRL. EOMI. Head: Atraumatic. Mouth/Throat: Mucous membranes are moist. Neck: No stridor.   Cardiovascular: Normal rate, regular rhythm. Good peripheral circulation. Grossly normal heart sounds. Respiratory: Normal respiratory effort.  No retractions. Lungs CTAB. Gastrointestinal: Soft and nontender. No distention.  Musculoskeletal: No lower extremity tenderness nor edema. No gross deformities of extremities. Neurologic:  Normal speech and language. No gross focal neurologic deficits are appreciated.  Skin:  Skin is warm, dry and intact. No rash noted. Psychiatric: Mood and affect are normal.  Speech and behavior are normal.    RADIOLOGY I, [place] N Estevon Fluke, personally viewed and evaluated these images (plain radiographs) as part of my medical decision making, as well as reviewing the written report by the radiologist.  Dg Chest 2 View  Result Date: 12/04/2016 CLINICAL DATA:  Cough, congestion, body aches, and fever for 2 days. Vomiting. EXAM: CHEST  2 VIEW COMPARISON:  03/19/2009 FINDINGS: The heart size and mediastinal contours are  within normal limits. Both lungs are clear. The visualized skeletal structures are unremarkable. IMPRESSION: No active cardiopulmonary disease. Electronically Signed   By: Burman NievesWilliam  Stevens M.D.   On: 12/04/2016 01:31      Procedures   INITIAL IMPRESSION / ASSESSMENT AND PLAN / ED COURSE  Pertinent labs & imaging results that were available during my care of the patient were reviewed by me and considered in my medical decision making (see chart for details).  History of physical exam consistent with influenza or other viral illness. Chest x-ray revealed no acute cardiopulmonary disease. Discussed Tamiflu with the patient and advised her that she was out of the therapeutic window however she requested at this time.      ____________________________________________  FINAL CLINICAL IMPRESSION(S) / ED DIAGNOSES  Final diagnoses:  Influenza     MEDICATIONS GIVEN DURING THIS VISIT:  Medications  benzonatate (TESSALON) capsule 200 mg (not administered)  ibuprofen (ADVIL,MOTRIN) tablet 800 mg (not administered)     NEW OUTPATIENT MEDICATIONS STARTED DURING THIS VISIT:  New Prescriptions   No medications on file    Modified Medications   No medications on file    Discontinued Medications   No medications on file     Note:  This document was prepared using Dragon voice recognition software and may include unintentional dictation errors.    Darci Currentandolph N Ibtisam Benge, MD 12/04/16 2251

## 2016-12-04 NOTE — ED Triage Notes (Signed)
Pt presents to ED with cough, congestion, body aches, and fever. Vomited 2X today. Concerned she has the flu. Onset of symptoms 2 days ago. otc medications taken at home with no relief.

## 2016-12-30 ENCOUNTER — Ambulatory Visit: Payer: PRIVATE HEALTH INSURANCE | Admitting: Obstetrics

## 2018-07-01 DIAGNOSIS — M25561 Pain in right knee: Secondary | ICD-10-CM | POA: Diagnosis not present

## 2018-07-01 DIAGNOSIS — Z793 Long term (current) use of hormonal contraceptives: Secondary | ICD-10-CM | POA: Diagnosis not present

## 2018-07-01 DIAGNOSIS — M25542 Pain in joints of left hand: Secondary | ICD-10-CM | POA: Diagnosis not present

## 2018-07-01 DIAGNOSIS — S60222A Contusion of left hand, initial encounter: Secondary | ICD-10-CM | POA: Diagnosis not present

## 2018-07-01 DIAGNOSIS — Z79899 Other long term (current) drug therapy: Secondary | ICD-10-CM | POA: Diagnosis not present

## 2018-07-01 DIAGNOSIS — S29011A Strain of muscle and tendon of front wall of thorax, initial encounter: Secondary | ICD-10-CM | POA: Diagnosis not present

## 2018-07-01 DIAGNOSIS — S8391XA Sprain of unspecified site of right knee, initial encounter: Secondary | ICD-10-CM | POA: Diagnosis not present

## 2018-07-01 DIAGNOSIS — R0789 Other chest pain: Secondary | ICD-10-CM | POA: Diagnosis not present

## 2018-07-01 DIAGNOSIS — M545 Low back pain: Secondary | ICD-10-CM | POA: Diagnosis not present

## 2018-07-01 DIAGNOSIS — Y92410 Unspecified street and highway as the place of occurrence of the external cause: Secondary | ICD-10-CM | POA: Diagnosis not present

## 2018-08-26 DIAGNOSIS — F4310 Post-traumatic stress disorder, unspecified: Secondary | ICD-10-CM | POA: Diagnosis not present

## 2018-08-26 DIAGNOSIS — F339 Major depressive disorder, recurrent, unspecified: Secondary | ICD-10-CM | POA: Diagnosis not present

## 2019-07-02 DIAGNOSIS — S40022A Contusion of left upper arm, initial encounter: Secondary | ICD-10-CM | POA: Diagnosis not present

## 2019-07-02 DIAGNOSIS — S138XXA Sprain of joints and ligaments of other parts of neck, initial encounter: Secondary | ICD-10-CM | POA: Diagnosis not present

## 2019-07-02 DIAGNOSIS — S060X0A Concussion without loss of consciousness, initial encounter: Secondary | ICD-10-CM | POA: Diagnosis not present

## 2019-07-03 ENCOUNTER — Other Ambulatory Visit: Payer: Self-pay

## 2019-07-03 ENCOUNTER — Emergency Department
Admission: EM | Admit: 2019-07-03 | Discharge: 2019-07-03 | Disposition: A | Discharge status: Home / Self Care | Payer: BC Managed Care – PPO | Attending: Emergency Medicine | Admitting: Emergency Medicine

## 2019-07-03 ENCOUNTER — Emergency Department: Payer: BC Managed Care – PPO

## 2019-07-03 DIAGNOSIS — S0990XA Unspecified injury of head, initial encounter: Secondary | ICD-10-CM | POA: Diagnosis not present

## 2019-07-03 DIAGNOSIS — S40812A Abrasion of left upper arm, initial encounter: Secondary | ICD-10-CM | POA: Diagnosis not present

## 2019-07-03 DIAGNOSIS — Y93I9 Activity, other involving external motion: Secondary | ICD-10-CM | POA: Diagnosis not present

## 2019-07-03 DIAGNOSIS — Y92410 Unspecified street and highway as the place of occurrence of the external cause: Secondary | ICD-10-CM | POA: Diagnosis not present

## 2019-07-03 DIAGNOSIS — S060X0A Concussion without loss of consciousness, initial encounter: Secondary | ICD-10-CM | POA: Insufficient documentation

## 2019-07-03 DIAGNOSIS — R52 Pain, unspecified: Secondary | ICD-10-CM | POA: Diagnosis not present

## 2019-07-03 DIAGNOSIS — Y999 Unspecified external cause status: Secondary | ICD-10-CM | POA: Insufficient documentation

## 2019-07-03 DIAGNOSIS — R42 Dizziness and giddiness: Secondary | ICD-10-CM | POA: Diagnosis not present

## 2019-07-03 DIAGNOSIS — S134XXA Sprain of ligaments of cervical spine, initial encounter: Secondary | ICD-10-CM | POA: Diagnosis not present

## 2019-07-03 DIAGNOSIS — R51 Headache: Secondary | ICD-10-CM | POA: Diagnosis not present

## 2019-07-03 MED ORDER — IBUPROFEN 800 MG PO TABS
800.0000 mg | ORAL_TABLET | ORAL | Status: AC
  Administered 2019-07-03: 800 mg via ORAL
  Filled 2019-07-03: qty 1
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
## 2019-07-03 NOTE — ED Notes (Signed)
AAOx3.  Skin warm and dry.  NAD.  MAE.  Ambulates with easy and steady gait.  NAD
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
## 2019-07-03 NOTE — Discharge Instructions (Addendum)
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
## 2019-07-18 ENCOUNTER — Encounter: Payer: Self-pay | Admitting: Emergency Medicine

## 2019-07-18 ENCOUNTER — Emergency Department: Payer: BC Managed Care – PPO

## 2019-07-18 ENCOUNTER — Other Ambulatory Visit: Payer: Self-pay

## 2019-07-18 ENCOUNTER — Emergency Department
Admission: EM | Admit: 2019-07-18 | Discharge: 2019-07-19 | Disposition: A | Discharge status: Home / Self Care | Payer: BC Managed Care – PPO | Attending: Emergency Medicine | Admitting: Emergency Medicine

## 2019-07-18 DIAGNOSIS — R079 Chest pain, unspecified: Secondary | ICD-10-CM | POA: Diagnosis not present

## 2019-07-18 DIAGNOSIS — R0789 Other chest pain: Secondary | ICD-10-CM | POA: Diagnosis not present

## 2019-07-18 DIAGNOSIS — Z975 Presence of (intrauterine) contraceptive device: Secondary | ICD-10-CM | POA: Diagnosis not present

## 2019-07-18 LAB — CBC WITH DIFFERENTIAL/PLATELET
Abs Immature Granulocytes: 0.02 10*3/uL (ref 0.00–0.07)
Basophils Absolute: 0 10*3/uL (ref 0.0–0.1)
Basophils Relative: 0 %
Eosinophils Absolute: 0.2 10*3/uL (ref 0.0–0.5)
Eosinophils Relative: 2 %
HCT: 40.1 % (ref 36.0–46.0)
Hemoglobin: 12.9 g/dL (ref 12.0–15.0)
Immature Granulocytes: 0 %
Lymphocytes Relative: 21 %
Lymphs Abs: 1.8 10*3/uL (ref 0.7–4.0)
MCH: 27.3 pg (ref 26.0–34.0)
MCHC: 32.2 g/dL (ref 30.0–36.0)
MCV: 85 fL (ref 80.0–100.0)
Monocytes Absolute: 0.7 10*3/uL (ref 0.1–1.0)
Monocytes Relative: 8 %
Neutro Abs: 6 10*3/uL (ref 1.7–7.7)
Neutrophils Relative %: 69 %
Platelets: 229 10*3/uL (ref 150–400)
RBC: 4.72 MIL/uL (ref 3.87–5.11)
RDW: 12.8 % (ref 11.5–15.5)
WBC: 8.8 10*3/uL (ref 4.0–10.5)
nRBC: 0 % (ref 0.0–0.2)

## 2019-07-18 LAB — COMPREHENSIVE METABOLIC PANEL
ALT: 21 U/L (ref 0–44)
AST: 19 U/L (ref 15–41)
Albumin: 3.8 g/dL (ref 3.5–5.0)
Alkaline Phosphatase: 36 U/L — ABNORMAL LOW (ref 38–126)
Anion gap: 8 (ref 5–15)
BUN: 6 mg/dL (ref 6–20)
CO2: 27 mmol/L (ref 22–32)
Calcium: 8.7 mg/dL — ABNORMAL LOW (ref 8.9–10.3)
Chloride: 101 mmol/L (ref 98–111)
Creatinine, Ser: 0.68 mg/dL (ref 0.44–1.00)
GFR calc Af Amer: >60 mL/min (ref >60)
GFR calc non Af Amer: >60 mL/min (ref >60)
Glucose, Bld: 100 mg/dL — ABNORMAL HIGH (ref 70–99)
Potassium: 3.7 mmol/L (ref 3.5–5.1)
Sodium: 136 mmol/L (ref 135–145)
Total Bilirubin: 0.5 mg/dL (ref 0.3–1.2)
Total Protein: 7.4 g/dL (ref 6.5–8.1)

## 2019-07-18 LAB — TROPONIN I (HIGH SENSITIVITY): Troponin I (High Sensitivity): <2 ng/L (ref <18)

## 2019-07-18 LAB — POCT PREGNANCY, URINE: Preg Test, Ur: NEGATIVE

## 2019-07-18 NOTE — ED Notes (Signed)
Lab results reviewed. Awaiting room for MD eval.  

## 2019-07-18 NOTE — ED Triage Notes (Signed)
Pt presents to ED with sharp left sided chest pain that radiates into her neck. Pt states onset was yesterday and worsening throughout the day. Painful with movement and when taking a deep breath. Pain not reproducible with palpation. Pt currently has no increased work of breathing or acute distress noted. Skin warm and dry.

## 2019-07-18 NOTE — ED Provider Notes (Signed)
[facility] Emergency Department Provider Note  ____________________________________________   First MD Initiated Contact with Patient 07/18/19 2338     (approximate)  I have reviewed the triage vital signs and the nursing notes.   HISTORY  Chief Complaint Chest Pain    HPI [name] is a 33 y.o. female    HPI: A 33 year old patient with a history of obesity presents for evaluation of chest pain. Initial onset of pain was more than 6 hours ago. The patient's chest pain is sharp and is not worse with exertion. The patient's chest pain is middle- or left-sided, is not well-localized, is not described as heaviness/pressure/tightness and does radiate to the arms/jaw/neck. The patient does not complain of nausea and denies diaphoresis. The patient has no history of stroke, has no history of peripheral artery disease, has not smoked in the past 90 days, denies any history of treated diabetes, has no relevant family history of coronary artery disease (first degree relative at less than age 68), is not hypertensive and has no history of hypercholesterolemia.    She has no history of blood clots in the legs of the lungs.  She has a Nexplanon implanted birth control device.  She has not been on any long trips or had any immobilizations.  She reports no unilateral leg pain or swelling.  She reports that her symptoms are worse when she lies down flat and better if she sits up.  She made the comment that the pain started about 2 days ago and that she has been working out and lifting weights with her boyfriend including a lot of chest and back exercises, but she cannot seem to reproduce the pain with moving around or pressing on her chest, only with taking deep breaths or lying down flat.  No trauma history.     Past Medical History:  Diagnosis Date   Migraine    Migraines     There are no active problems to display for this patient.   Past Surgical History:    Procedure Laterality Date   WISDOM TOOTH EXTRACTION      Prior to Admission medications   Medication Sig Start Date End Date Taking? Authorizing Provider  aspirin-acetaminophen-caffeine (EXCEDRIN MIGRAINE) [phone] MG tablet Take 2 tablets by mouth every 6 (six) hours as needed for headache or migraine.    [provider]  chlorpheniramine-HYDROcodone (TUSSIONEX PENNKINETIC ER) 10-8 MG/5ML SUER Take 5 mLs by mouth every 12 (twelve) hours as needed. 12/04/16   Darci Current, MD  ibuprofen (ADVIL,MOTRIN) 200 MG tablet Take 400 mg by mouth once as needed. For headache    [provider]  levonorgestrel (MIRENA) 20 MCG/24HR IUD 1 each by Intrauterine route once. Inserted in 2014    [provider]    Allergies Patient has no known allergies.  No family history on file.  Social History Social History   Tobacco Use   Smoking status: Never Smoker   Smokeless tobacco: Never Used  Substance Use Topics   Alcohol use: Yes    Comment: occasional   Drug use: No    Review of Systems Constitutional: No fever/chills Eyes: No visual changes. ENT: No sore throat. Cardiovascular: +chest pain that occasionally radiates to the back and the left side of the neck Respiratory: Denies shortness of breath. Gastrointestinal: No abdominal pain.  No nausea, no vomiting.  No diarrhea.  No constipation. Genitourinary: Negative for dysuria. Musculoskeletal: Negative for neck pain/back pain except as described above. Integumentary:  Negative for rash. Neurological: Negative for headaches, focal weakness or numbness.   ____________________________________________   PHYSICAL EXAM:  VITAL SIGNS: ED Triage Vitals  Enc Vitals Group     BP 07/18/19 2112 112/64     Pulse Rate 07/18/19 2112 86     Resp 07/18/19 2112 16     Temp 07/18/19 2112 98.9 F (37.2 C)     Temp Source 07/18/19 2112 Oral     SpO2 07/18/19 2112 100 %     Weight 07/18/19 2112 104.3 kg (230 lb)      Height 07/18/19 2112 1.651 m (5\' 5" )     Head Circumference --      Peak Flow --      Pain Score 07/18/19 2118 8     Pain Loc --      Pain Edu? --      Excl. in GC? --     Constitutional: Alert and oriented.  No acute distress.  Well-appearing. Eyes: Conjunctivae are normal.  Head: Atraumatic. Nose: No congestion/rhinnorhea. Mouth/Throat: Mucous membranes are moist. Neck: No stridor.  No meningeal signs.   Cardiovascular: Normal rate, regular rhythm. Good peripheral circulation. Grossly normal heart sounds. Respiratory: Normal respiratory effort.  No retractions. Gastrointestinal: Obese.  Soft and nontender. No distention.  Musculoskeletal: No reproducible chest wall tenderness.  No pain or tenderness with range of motion of the left arm and shoulder.  No lower extremity tenderness nor edema. No gross deformities of extremities. Neurologic:  Normal speech and language. No gross focal neurologic deficits are appreciated.  Skin:  Skin is warm, dry and intact. Psychiatric: Mood and affect are normal. Speech and behavior are normal.  ____________________________________________   LABS (all labs ordered are listed, but only abnormal results are displayed)  Labs Reviewed  COMPREHENSIVE METABOLIC PANEL - Abnormal; Notable for the following components:      Result Value   Glucose, Bld 100 (*)    Calcium 8.7 (*)    Alkaline Phosphatase 36 (*)    All other components within normal limits  CBC WITH DIFFERENTIAL/PLATELET  FIBRIN DERIVATIVES D-DIMER ([facility] ONLY)  POC URINE PREG, ED  POCT PREGNANCY, URINE  TROPONIN I (HIGH SENSITIVITY)   ____________________________________________  EKG  ED ECG REPORT I, 2119, the attending physician, personally viewed and interpreted this ECG.  Date: 07/18/2019 EKG Time: 21: 06 Rate: 95 Rhythm: normal sinus rhythm QRS Axis: normal Intervals: normal ST/T Wave abnormalities: normal Narrative Interpretation: no evidence of acute  ischemia  ____________________________________________  RADIOLOGY I, 07, personally viewed and evaluated these images (plain radiographs) as part of my medical decision making, as well as reviewing the written report by the radiologist.  ED MD interpretation: No acute abnormalities identified on chest x-ray  Official radiology report(s): Dg Chest 2 View  Result Date: 07/18/2019 CLINICAL DATA:  33 year old female with chest pain. EXAM: CHEST - 2 VIEW COMPARISON:  Chest radiograph dated 12/04/2016 FINDINGS: The heart size and mediastinal contours are within normal limits. Both lungs are clear. The visualized skeletal structures are unremarkable. IMPRESSION: No active cardiopulmonary disease. Electronically Signed   By: 02/01/2017 M.D.   On: 07/18/2019 22:46    ____________________________________________   PROCEDURES   Procedure(s) performed (including Critical Care):  Procedures   ____________________________________________   INITIAL IMPRESSION / MDM / ASSESSMENT AND PLAN / ED COURSE  As part of my medical decision making, I reviewed the following data within the electronic MEDICAL RECORD NUMBER Nursing notes reviewed and incorporated, Labs reviewed , EKG  interpreted , Old chart reviewed, Radiograph reviewed  and Notes from prior ED visits   Differential diagnosis includes, but is not limited to, musculoskeletal pain, PE, pneumonia, and COVID-19, ACS, pericarditis/myocarditis.  The patient has aHEAR Score: 1 and is PERC negative.  However I do not have a better alternative diagnosis.  Even though musculoskeletal pain is most likely, it is not reproducible pain with range of motion or palpation.  I talked with her about the negative predictive value of a d-dimer and I decided to get a dimer on her.  There is no indication for repeat troponin based on her HEAR score of 1.  She has no signs or symptoms of infection.  EKG is normal.  CBC, comprehensive metabolic panel, and  initial high-sensitivity troponin are all normal.  Patient agrees with the plan for the dimer and discharge with outpatient follow-up if it is normal.   Clinical Course as of Jul 19 155  Wed Jul 19, 2019  0155 Normal d-dimer, I updated the patient who is been resting quietly and she is comfortable with the plan for discharge and outpatient follow-up.  I gave my usual customary return precautions.  Fibrin derivatives D-dimer [facility]): 357.02 [CF]    Clinical Course User Index [CF] Hinda Kehr, MD     ____________________________________________  FINAL CLINICAL IMPRESSION(S) / ED DIAGNOSES  Final diagnoses:  Chest pain, unspecified type     MEDICATIONS GIVEN DURING THIS VISIT:  Medications - No data to display   ED Discharge Orders    None      *Please note:  [name] was evaluated in Emergency Department on 07/19/2019 for the symptoms described in the history of present illness. She was evaluated in the context of the global COVID-19 pandemic, which necessitated consideration that the patient might be at risk for infection with the SARS-CoV-2 virus that causes COVID-19. Institutional protocols and algorithms that pertain to the evaluation of patients at risk for COVID-19 are in a state of rapid change based on information released by regulatory bodies including the CDC and federal and state organizations. These policies and algorithms were followed during the patient's care in the ED.  Some ED evaluations and interventions may be delayed as a result of limited staffing during the pandemic.*  Note:  This document was prepared using Dragon voice recognition software and may include unintentional dictation errors.   Hinda Kehr, MD 07/19/19 [phone]

## 2019-07-19 LAB — FIBRIN DERIVATIVES D-DIMER (ARMC ONLY): Fibrin derivatives D-dimer (ARMC): 357.02 ng/mL (FEU) (ref 0.00–499.00)

## 2019-07-19 NOTE — Discharge Instructions (Signed)
Your workup in the Emergency Department today was reassuring.  We did not find any specific abnormalities.  We recommend you drink plenty of fluids, take your regular medications and/or any new ones prescribed today, and follow up with the doctor(s) listed in these documents as recommended.  Return to the Emergency Department if you develop new or worsening symptoms that concern you.  

## 2019-10-16 DIAGNOSIS — R519 Headache, unspecified: Secondary | ICD-10-CM | POA: Diagnosis not present

## 2019-10-16 DIAGNOSIS — M9901 Segmental and somatic dysfunction of cervical region: Secondary | ICD-10-CM | POA: Diagnosis not present

## 2019-10-16 DIAGNOSIS — M436 Torticollis: Secondary | ICD-10-CM | POA: Diagnosis not present

## 2019-10-16 DIAGNOSIS — M542 Cervicalgia: Secondary | ICD-10-CM | POA: Diagnosis not present

## 2019-12-20 DIAGNOSIS — R635 Abnormal weight gain: Secondary | ICD-10-CM | POA: Diagnosis not present

## 2019-12-20 DIAGNOSIS — F064 Anxiety disorder due to known physiological condition: Secondary | ICD-10-CM | POA: Diagnosis not present

## 2019-12-20 DIAGNOSIS — R519 Headache, unspecified: Secondary | ICD-10-CM | POA: Diagnosis not present

## 2019-12-20 DIAGNOSIS — E6609 Other obesity due to excess calories: Secondary | ICD-10-CM | POA: Diagnosis not present

## 2019-12-20 DIAGNOSIS — Z803 Family history of malignant neoplasm of breast: Secondary | ICD-10-CM | POA: Diagnosis not present

## 2020-01-02 DIAGNOSIS — Z1231 Encounter for screening mammogram for malignant neoplasm of breast: Secondary | ICD-10-CM | POA: Diagnosis not present

## 2020-01-09 DIAGNOSIS — R928 Other abnormal and inconclusive findings on diagnostic imaging of breast: Secondary | ICD-10-CM | POA: Diagnosis not present

## 2020-01-25 DIAGNOSIS — R635 Abnormal weight gain: Secondary | ICD-10-CM | POA: Diagnosis not present

## 2020-01-25 DIAGNOSIS — F064 Anxiety disorder due to known physiological condition: Secondary | ICD-10-CM | POA: Diagnosis not present

## 2020-01-25 DIAGNOSIS — R519 Headache, unspecified: Secondary | ICD-10-CM | POA: Diagnosis not present

## 2020-01-25 DIAGNOSIS — Z803 Family history of malignant neoplasm of breast: Secondary | ICD-10-CM | POA: Diagnosis not present

## 2020-04-04 ENCOUNTER — Ambulatory Visit: Payer: Self-pay

## 2020-04-04 ENCOUNTER — Encounter: Payer: Self-pay | Admitting: Advanced Practice Midwife

## 2020-04-04 ENCOUNTER — Ambulatory Visit (LOCAL_COMMUNITY_HEALTH_CENTER): Payer: BC Managed Care – PPO | Admitting: Advanced Practice Midwife

## 2020-04-04 ENCOUNTER — Other Ambulatory Visit: Payer: Self-pay

## 2020-04-04 VITALS — BP 127/76 | Ht 66.0 in | Wt 241.0 lb

## 2020-04-04 DIAGNOSIS — Z3009 Encounter for other general counseling and advice on contraception: Secondary | ICD-10-CM

## 2020-04-04 DIAGNOSIS — E669 Obesity, unspecified: Secondary | ICD-10-CM | POA: Diagnosis not present

## 2020-04-04 DIAGNOSIS — Z3046 Encounter for surveillance of implantable subdermal contraceptive: Secondary | ICD-10-CM | POA: Diagnosis not present

## 2020-04-04 NOTE — Progress Notes (Signed)
Here today for Nexplanon removal. Last PE here was 02/12/2017 (no Pap records.) Nexplanon was inserted here 04/30/2017. Patient declines PE today secondary to states "I had a physical 2 months ago at my PCP." ROI obtained for above. Declines all STD screening and birth control. Desires a pregnancy in the next year. MVI's encouraged. Tawny Hopping, RN

## 2020-04-04 NOTE — Progress Notes (Signed)
34 yo engaged BF G1P1  Here for Nexplanon removal.  States last PE and pap at [facility] in [place] 3 mo ago.  LMP 03/12/20.  Last sex last night without condom with partner x 2.5 years.  Wants to conceive.  Last PE here 02/12/17.  Nexplanon inserted here 04/30/17.  No pap records. Nexplanon Removal Patient identified, informed consent performed, consent signed.   Appropriate time out taken. Nexplanon site identified.  Area prepped in usual sterile fashon. 3 ml of 1% lidocaine with Epinephrine was used to anesthetize the area at the distal end of the implant and along implant site. A small stab incision was made right beside the implant on the distal portion.  The Nexplanon rod was grasped using straight hemostats/manual and removed without difficulty.  There was minimal blood loss. There were no complications.  Steri-strips were applied over the small incision.  A pressure bandage was applied to reduce any bruising.  The patient tolerated the procedure well and was given post procedure instructions.   Nexplanon:   Counseled patient to take OTC analgesic starting as soon as lidocaine starts to wear off and take regularly for at least 48 hr to decrease discomfort.  Specifically to take with food or milk to decrease stomach upset and for IB 600 mg (3 tablets) every 6 hrs; IB 800 mg (4 tablets) every 8 hrs; or Aleve 2 tablets every 12 hrs.

## 2020-05-23 ENCOUNTER — Ambulatory Visit
Admission: EM | Admit: 2020-05-23 | Discharge: 2020-05-23 | Disposition: A | Discharge status: Home / Self Care | Payer: BC Managed Care – PPO | Attending: Emergency Medicine | Admitting: Emergency Medicine

## 2020-05-23 ENCOUNTER — Other Ambulatory Visit: Payer: Self-pay

## 2020-05-23 DIAGNOSIS — H6692 Otitis media, unspecified, left ear: Secondary | ICD-10-CM | POA: Diagnosis not present

## 2020-05-23 MED ORDER — AMOXICILLIN 875 MG PO TABS: 875.0000 mg | ORAL_TABLET | Freq: Two times a day (BID) | ORAL | 0 refills | Status: AC

## 2020-05-23 NOTE — Discharge Instructions (Addendum)
Take the amoxicillin as directed.  Take Tylenol or ibuprofen as needed for discomfort.    Follow up with your primary care provider if your symptoms are not improving.     

## 2020-05-23 NOTE — ED Provider Notes (Signed)
Renaldo Fiddler    CSN: [id number] Arrival date & time: 05/23/20  1128      History   Chief Complaint Chief Complaint  Patient presents with   Otalgia    HPI [name] is a 34 y.o. female.   Patient presents with bilateral earache x4 days; L>R.  No drainage or pain with movement.  She states it feels like she has fluid behind her ears.  She denies fever, chills, sore throat, cough, shortness of breath, abdominal pain, vomiting, diarrhea, rash, or other symptoms.  Treatment attempted at home with OTC eardrops.  The history is provided by the patient.    Past Medical History:  Diagnosis Date   Migraine    Migraines     Patient Active Problem List   Diagnosis Date Noted   Obesity BMI=38.9 04/04/2020    Past Surgical History:  Procedure Laterality Date   WISDOM TOOTH EXTRACTION      OB History    Gravida  1   Para  1   Term  1   Preterm  0   AB  0   Living  1     SAB  0   TAB  0   Ectopic  0   Multiple  0   Live Births  1            Home Medications    Prior to Admission medications   Medication Sig Start Date End Date Taking? Authorizing Provider  amoxicillin (AMOXIL) 875 MG tablet Take 1 tablet (875 mg total) by mouth 2 (two) times daily for 7 days. 05/23/20 05/30/20  Mickie Bail, NP  aspirin-acetaminophen-caffeine (EXCEDRIN MIGRAINE) [phone] MG tablet Take 2 tablets by mouth every 6 (six) hours as needed for headache or migraine. Patient not taking: Reported on 04/04/2020    [provider]  chlorpheniramine-HYDROcodone (TUSSIONEX PENNKINETIC ER) 10-8 MG/5ML SUER Take 5 mLs by mouth every 12 (twelve) hours as needed. Patient not taking: Reported on 04/04/2020 12/04/16   Darci Current, MD  etonogestrel (NEXPLANON) 68 MG IMPL implant Inject into the skin. 04/30/17   Matt Holmes, PA  ibuprofen (ADVIL,MOTRIN) 200 MG tablet Take 400 mg by mouth once as needed. For headache Patient not taking: Reported on  04/04/2020    [provider]  levonorgestrel (MIRENA) 20 MCG/24HR IUD 1 each by Intrauterine route once. Inserted in 2014 Patient not taking: Reported on 04/04/2020    [provider]    Family History Family History  Problem Relation Age of Onset   Breast cancer Mother    Heart disease Father     Social History Social History   Tobacco Use   Smoking status: Never Smoker   Smokeless tobacco: Never Used  Building services engineer Use: Never used  Substance Use Topics   Alcohol use: Yes    Comment: occasional   Drug use: No     Allergies   Patient has no known allergies.   Review of Systems Review of Systems  Constitutional: Negative for chills and fever.  HENT: Positive for ear pain. Negative for sore throat.   Eyes: Negative for pain and visual disturbance.  Respiratory: Negative for cough and shortness of breath.   Cardiovascular: Negative for chest pain and palpitations.  Gastrointestinal: Negative for abdominal pain and vomiting.  Genitourinary: Negative for dysuria and hematuria.  Musculoskeletal: Negative for arthralgias and back pain.  Skin: Negative for color change and rash.  Neurological: Negative for seizures  and syncope.  All other systems reviewed and are negative.    Physical Exam Triage Vital Signs ED Triage Vitals  Enc Vitals Group     BP 05/23/20 1138 120/81     Pulse Rate 05/23/20 1138 83     Resp 05/23/20 1138 14     Temp 05/23/20 1138 98.9 F (37.2 C)     Temp src --      SpO2 05/23/20 1138 98 %     Weight --      Height --      Head Circumference --      Peak Flow --      Pain Score 05/23/20 1134 8     Pain Loc --      Pain Edu? --      Excl. in GC? --    No data found.  Updated Vital Signs BP 120/81    Pulse 83    Temp 98.9 F (37.2 C)    Resp 14    LMP 05/07/2020 (Exact Date)    SpO2 98%   Visual Acuity Right Eye Distance:   Left Eye Distance:   Bilateral Distance:    Right Eye Near:   Left Eye  Near:    Bilateral Near:     Physical Exam Vitals and nursing note reviewed.  Constitutional:      General: She is not in acute distress.    Appearance: She is well-developed.  HENT:     Head: Normocephalic and atraumatic.     Right Ear: Ear canal and external ear normal. Tympanic membrane is not erythematous.     Left Ear: Ear canal and external ear normal. Tympanic membrane is erythematous.     Nose: Nose normal.     Mouth/Throat:     Mouth: Mucous membranes are moist.     Pharynx: Oropharynx is clear.  Eyes:     Conjunctiva/sclera: Conjunctivae normal.  Cardiovascular:     Rate and Rhythm: Normal rate and regular rhythm.     Heart sounds: No murmur heard.   Pulmonary:     Effort: Pulmonary effort is normal. No respiratory distress.     Breath sounds: Normal breath sounds.  Abdominal:     Palpations: Abdomen is soft.     Tenderness: There is no abdominal tenderness. There is no guarding or rebound.  Musculoskeletal:     Cervical back: Neck supple.  Skin:    General: Skin is warm and dry.     Findings: No rash.  Neurological:     General: No focal deficit present.     Mental Status: She is alert and oriented to person, place, and time.     Gait: Gait normal.  Psychiatric:        Mood and Affect: Mood normal.        Behavior: Behavior normal.      UC Treatments / Results  Labs (all labs ordered are listed, but only abnormal results are displayed) Labs Reviewed - No data to display  EKG   Radiology No results found.  Procedures Procedures (including critical care time)  Medications Ordered in UC Medications - No data to display  Initial Impression / Assessment and Plan / UC Course  I have reviewed the triage vital signs and the nursing notes.  Pertinent labs & imaging results that were available during my care of the patient were reviewed by me and considered in my medical decision making (see chart for details).   Left otitis media.  Patient declines  COVID test.  Treating with amoxicillin.  Discussed symptomatic treatment with Tylenol or ibuprofen as needed.  Instructed her to follow-up with her PCP if her symptoms are not improving.  Patient agrees to plan of care.     Final Clinical Impressions(s) / UC Diagnoses   Final diagnoses:  Left otitis media, unspecified otitis media type     Discharge Instructions     Take the amoxicillin as directed.  Take Tylenol or ibuprofen as needed for discomfort.     Follow up with your primary care provider if your symptoms are not improving.       ED Prescriptions    Medication Sig Dispense Auth. Provider   amoxicillin (AMOXIL) 875 MG tablet Take 1 tablet (875 mg total) by mouth 2 (two) times daily for 7 days. 14 tablet Mickie Bail, NP     PDMP not reviewed this encounter.   Mickie Bail, NP 05/23/20 1153

## 2020-05-23 NOTE — ED Triage Notes (Signed)
Patient complains of bilateral ear pain x4 days. States the left hurts worse than the right.  Denies: sore throat  OTC: eardrops, allergy medicine

## 2020-06-01 DIAGNOSIS — R519 Headache, unspecified: Secondary | ICD-10-CM | POA: Diagnosis not present

## 2020-06-01 DIAGNOSIS — H9202 Otalgia, left ear: Secondary | ICD-10-CM | POA: Diagnosis not present

## 2020-06-01 DIAGNOSIS — R635 Abnormal weight gain: Secondary | ICD-10-CM | POA: Diagnosis not present

## 2020-06-26 DIAGNOSIS — Z3201 Encounter for pregnancy test, result positive: Secondary | ICD-10-CM | POA: Diagnosis not present

## 2020-06-26 DIAGNOSIS — N912 Amenorrhea, unspecified: Secondary | ICD-10-CM | POA: Diagnosis not present

## 2020-08-01 ENCOUNTER — Other Ambulatory Visit: Payer: Self-pay

## 2020-08-01 ENCOUNTER — Ambulatory Visit (INDEPENDENT_AMBULATORY_CARE_PROVIDER_SITE_OTHER): Payer: BC Managed Care – PPO

## 2020-08-01 VITALS — BP 129/82 | HR 93 | Ht 65.0 in | Wt 257.7 lb

## 2020-08-01 DIAGNOSIS — Z3A08 8 weeks gestation of pregnancy: Secondary | ICD-10-CM

## 2020-08-01 DIAGNOSIS — Z789 Other specified health status: Secondary | ICD-10-CM

## 2020-08-01 DIAGNOSIS — O219 Vomiting of pregnancy, unspecified: Secondary | ICD-10-CM

## 2020-08-01 DIAGNOSIS — O3680X0 Pregnancy with inconclusive fetal viability, not applicable or unspecified: Secondary | ICD-10-CM

## 2020-08-01 DIAGNOSIS — N912 Amenorrhea, unspecified: Secondary | ICD-10-CM | POA: Diagnosis not present

## 2020-08-01 DIAGNOSIS — Z3481 Encounter for supervision of other normal pregnancy, first trimester: Secondary | ICD-10-CM | POA: Diagnosis not present

## 2020-08-01 DIAGNOSIS — Z3491 Encounter for supervision of normal pregnancy, unspecified, first trimester: Secondary | ICD-10-CM

## 2020-08-01 DIAGNOSIS — Z3490 Encounter for supervision of normal pregnancy, unspecified, unspecified trimester: Secondary | ICD-10-CM | POA: Insufficient documentation

## 2020-08-01 LAB — POCT URINE PREGNANCY: Preg Test, Ur: POSITIVE — AB

## 2020-08-01 MED ORDER — BLOOD PRESSURE KIT KIT: 1.0000 | PACK | 0 refills | Status: DC

## 2020-08-01 MED ORDER — DOXYLAMINE-PYRIDOXINE 10-10 MG PO TBEC: DELAYED_RELEASE_TABLET | ORAL | 6 refills | Status: DC

## 2020-08-01 NOTE — Progress Notes (Signed)
PRENATAL INTAKE SUMMARY  [name] presents today New OB Nurse Interview.  OB History    Gravida  2   Para  1   Term  1   Preterm  0   AB  0   Living  1     SAB  0   TAB  0   Ectopic  0   Multiple  0   Live Births  1          I have reviewed the patient's medical, obstetrical, social, and family histories, medications, and available lab results.  SUBJECTIVE She has no unusual complaints and complains of nausea and vomiting. Diclegis sent to the pharmacy per protocol.  OBJECTIVE Initial Physical Exam (New OB)  GENERAL APPEARANCE: alert, well appearing   ASSESSMENT Normal pregnancy  PLAN Prenatal care to be completed at [facility] will be completed at NEW OB VISIT Blood Pressure Kit ordered Baby Scripts Ordered Diclegis sent to pharmacy for N/V PHQ2 score: 0 GAD score: 2 U/S performed today reveals Single live IUP at [id number] [place] FHR 164 FLU Vaccine Declined

## 2020-08-02 ENCOUNTER — Other Ambulatory Visit: Payer: Self-pay

## 2020-08-02 DIAGNOSIS — Z3491 Encounter for supervision of normal pregnancy, unspecified, first trimester: Secondary | ICD-10-CM

## 2020-08-02 MED ORDER — BLOOD PRESSURE KIT KIT: 1.0000 | PACK | 0 refills | Status: DC

## 2020-08-03 NOTE — Progress Notes (Signed)
Patient was assessed and managed by nursing staff during this encounter. I have reviewed the chart and agree with the documentation and plan. I have also made any necessary editorial changes.  Catalina Antigua, MD 08/03/2020 11:43 AM

## 2020-08-08 ENCOUNTER — Ambulatory Visit (INDEPENDENT_AMBULATORY_CARE_PROVIDER_SITE_OTHER): Payer: BC Managed Care – PPO

## 2020-08-08 ENCOUNTER — Other Ambulatory Visit: Payer: Self-pay

## 2020-08-08 ENCOUNTER — Other Ambulatory Visit (HOSPITAL_COMMUNITY)
Admission: RE | Admit: 2020-08-08 | Discharge: 2020-08-08 | Disposition: A | Discharge status: Home / Self Care | Payer: BC Managed Care – PPO | Source: Ambulatory Visit

## 2020-08-08 VITALS — BP 117/74 | HR 85 | Wt 254.0 lb

## 2020-08-08 DIAGNOSIS — O219 Vomiting of pregnancy, unspecified: Secondary | ICD-10-CM

## 2020-08-08 DIAGNOSIS — Z3480 Encounter for supervision of other normal pregnancy, unspecified trimester: Secondary | ICD-10-CM | POA: Insufficient documentation

## 2020-08-08 DIAGNOSIS — B9689 Other specified bacterial agents as the cause of diseases classified elsewhere: Secondary | ICD-10-CM

## 2020-08-08 DIAGNOSIS — Z6841 Body Mass Index (BMI) 40.0 and over, adult: Secondary | ICD-10-CM

## 2020-08-08 DIAGNOSIS — Z124 Encounter for screening for malignant neoplasm of cervix: Secondary | ICD-10-CM

## 2020-08-08 DIAGNOSIS — Z3481 Encounter for supervision of other normal pregnancy, first trimester: Secondary | ICD-10-CM | POA: Diagnosis not present

## 2020-08-08 DIAGNOSIS — Z3A09 9 weeks gestation of pregnancy: Secondary | ICD-10-CM | POA: Insufficient documentation

## 2020-08-08 DIAGNOSIS — N760 Acute vaginitis: Secondary | ICD-10-CM

## 2020-08-08 DIAGNOSIS — O23591 Infection of other part of genital tract in pregnancy, first trimester: Secondary | ICD-10-CM | POA: Diagnosis not present

## 2020-08-08 DIAGNOSIS — O99210 Obesity complicating pregnancy, unspecified trimester: Secondary | ICD-10-CM | POA: Diagnosis not present

## 2020-08-08 NOTE — Progress Notes (Signed)
Pt presents for NOB provider visit  NOB nurse intake completed 08/01/2020  Pt c/o N/V - Unable to pick up [employer] d/t high copay

## 2020-08-08 NOTE — Progress Notes (Signed)
Subjective:   [name] is a 34 y.o. G2P1001 at 63w6dby 05/31/2020 LMP being seen today for her first obstetrical visit.  Patient reports this was a planned pregnancy. She expresses desire for a waterbirth with this delivery and has had a previous child 13 years ago!  Patient states she has been looking for a mArchitectural technologistand is happy to have found [place]   Gynecological/Obstetrical History: Her obstetrical history is significant for obesity. Patient does intend to breast feed. Pregnancy history fully reviewed. Patient reports nausea and vomiting and states she is unable to pick up her diclegis due to costs.   Sexual Activity and Vaginal Concerns: None  Medical History/ROS: Denies  Social History: Patient denies current usage of tobacco, alcohol, or drugs and endorses history of alcohol usage prior to pregnancy.  Patient reports the FOB is Laurell Lattimore who is involved and supportive.  Patient reports that she lives with 120year old daughter and fiance endorses safety at home.  Patient denies DV/A. 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   Past Medical History:  Diagnosis Date  . Migraine    Past Surgical History:  Procedure Laterality Date  . WISDOM TOOTH EXTRACTION     Family History  Problem Relation Age of Onset  . Breast cancer Mother   . Heart disease Father   . Breast cancer Maternal Grandmother    Social History   Tobacco Use  . Smoking status: Never Smoker  . Smokeless tobacco: Never Used  Vaping Use  . Vaping Use: Never used  Substance Use Topics  . Alcohol use: Not Currently    Comment: Last drink 05/2020  . Drug use: No   No Known  Allergies Current Outpatient Medications on File Prior to Visit  Medication Sig Dispense Refill  . Prenatal MV-Min-FA-Omega-3 (PRENATAL GUMMIES/DHA & FA) 0.4-32.5 MG CHEW Chew by mouth.    . Probiotic Product (PROBIOTIC-10) CHEW Chew by mouth.    . Blood Pressure Monitoring (BLOOD PRESSURE KIT) KIT 1 kit by Does not apply route once a week. (Patient not taking: Reported on 08/08/2020) 1 kit 0  . Doxylamine-Pyridoxine (DICLEGIS) 10-10 MG TBEC Take 2 tabs qhs then add 1 tab A.M and midday prn (Patient not taking: Reported on 08/08/2020) 90 tablet 6   No current facility-administered medications on file prior to visit.    Review of Systems Pertinent items noted in HPI and remainder of comprehensive ROS otherwise negative.  Exam   Vitals:   08/08/20 1030  BP: 117/74  Pulse: 85  Weight: 254 lb (115.2 kg)   Fetal Heart Rate (bpm): 164  Physical Exam Constitutional:      Appearance: Normal appearance. She is obese.  Genitourinary:     No vaginal discharge, tenderness or bleeding.     Cervix is parous.     No cervical friability, lesion, erythema, bleeding, polyp, nabothian cyst or pinkness.     Genitourinary Comments: Pap collected with brush and spatula.  CV Collected  Uterine size and  position difficult to assess s/t body habitus.   HENT:     Head: Normocephalic and atraumatic.  Eyes:     Conjunctiva/sclera: Conjunctivae normal.  Cardiovascular:     Rate and Rhythm: Normal rate and regular rhythm.     Heart sounds: Normal heart sounds.  Pulmonary:     Effort: Pulmonary effort is normal. No respiratory distress.     Breath sounds: Normal breath sounds.  Chest:     Breasts:        Right: No mass, nipple discharge or skin change.        Left: No mass, nipple discharge or skin change.     Comments: CBE Performed Abdominal:     General: Bowel sounds are normal.     Palpations: Abdomen is soft.  Musculoskeletal:     Cervical back: Normal range of motion.  Neurological:      Mental Status: She is alert.  Psychiatric:        Mood and Affect: Mood normal.        Behavior: Behavior normal.        Thought Content: Thought content normal.     Assessment:   34 y.o. year old G2P1001 Patient Active Problem List   Diagnosis Date Noted  . Encounter for supervision of normal pregnancy, antepartum 08/01/2020  . Obesity BMI=38.9 04/04/2020     Plan:  1. Supervision of other normal pregnancy, antepartum -Congratulations given and patient welcomed to practice. -Discussed usage of Babyscripts and virtual visits as additional source of managing and completing PN visits in midst of coronavirus.   *Instructed to take blood pressure and record weekly into babyscripts. *Reviewed modified prenatal visit schedule and platforms used for virtual visits.  -Anticipatory guidance for prenatal visits including labs, ultrasounds, and testing; Initial labs drawn. -Genetic Screening discussed, First trimester screen, Quad screen and NIPS: requested.  Will wait until next visit d/t GA and BMI. -Encouraged to complete MyChart Registration for her ability to review results, send requests, and have questions addressed.  -Discussed estimated due date of Mar 07, 2021. -Ultrasound discussed; fetal anatomic survey: ordered. -Initiate prenatal vitamins; Rx sent to pharmacy on file.  -Influenza offered and declines. -Encouraged to seek out care at office or emergency room for urgent and/or emergent concerns. -Educated on the nature of [facility] with multiple MDs and other Advanced Practice Providers was explained to patient; also emphasized that residents, students are part of our team. Informed of her right to refuse care as she deems appropriate.  -No questions or concerns.   2. Nausea and vomiting during pregnancy -Will send in generic prescription for diclegis. -Discussed management of symptoms with other methods.   3. [redacted] weeks gestation of  pregnancy -Too early for Panorama. -Plan to RTO in 5-6 weeks for in office visit with AFP and Panorama -Reviewed desires for WB.  Given information and shown [email or web address] website for registering. -Informed that WB consent would be signed after completion of course. -Reviewed exclusion criteria including but not limited to HTN, uncontrolled GTT, fetal distress, +Covid, and provider unavailability.   4. Obesity in pregnancy -Discussed initiation of bASA at 12 weeks. -Reviewed additional labs including HgB A1C, CMP, and PC Ratio  5. Morbid obesity with BMI of 40.0-44.9, adult ([place]) -Weight gain of 11-20lbs -Plan for antenatal testing as appropriate. -Detailed anatomy scan ordered  6. Screening for cervical cancer -Pap smear collected.   Problem list reviewed and updated. Routine obstetric precautions reviewed.  Orders Placed  This Encounter  Procedures  . Culture, OB Urine  . [place] MFM OB DETAIL +14 WK    Standing Status:   Future    Standing Expiration Date:   08/08/2021    Order Specific Question:   Reason for Exam (SYMPTOM  OR DIAGNOSIS REQUIRED)    Answer:   Obesity    Order Specific Question:   Preferred Location    Answer:   [facility]  . CBC/D/Plt+RPR+Rh+ABO+Rub Ab...  . Comp Met (CMET)  . HgB A1c  . Protein / creatinine ratio, urine  . Interpretation:    Return in about 6 weeks (around 09/19/2020) for [place] with Panorama and AFP.     Maryann Conners, CNM 08/08/2020 11:09 AM

## 2020-08-09 LAB — CBC/D/PLT+RPR+RH+ABO+RUB AB...
Antibody Screen: NEGATIVE
Basophils Absolute: 0 10*3/uL (ref 0.0–0.2)
Basos: 0 %
EOS (ABSOLUTE): 0.2 10*3/uL (ref 0.0–0.4)
Eos: 3 %
HCV Ab: <0.1 s/co ratio (ref 0.0–0.9)
HIV Screen 4th Generation wRfx: NONREACTIVE
Hematocrit: 36.7 % (ref 34.0–46.6)
Hemoglobin: 12.7 g/dL (ref 11.1–15.9)
Hepatitis B Surface Ag: NEGATIVE
Immature Grans (Abs): 0 10*3/uL (ref 0.0–0.1)
Immature Granulocytes: 0 %
Lymphocytes Absolute: 1.5 10*3/uL (ref 0.7–3.1)
Lymphs: 20 %
MCH: 28.5 pg (ref 26.6–33.0)
MCHC: 34.6 g/dL (ref 31.5–35.7)
MCV: 83 fL (ref 79–97)
Monocytes Absolute: 0.6 10*3/uL (ref 0.1–0.9)
Monocytes: 8 %
Neutrophils Absolute: 5.4 10*3/uL (ref 1.4–7.0)
Neutrophils: 69 %
Platelets: 232 10*3/uL (ref 150–450)
RBC: 4.45 x10E6/uL (ref 3.77–5.28)
RDW: 13.3 % (ref 11.7–15.4)
RPR Ser Ql: NONREACTIVE
Rh Factor: POSITIVE
Rubella Antibodies, IGG: 2.67 index (ref >0.99)
WBC: 7.8 10*3/uL (ref 3.4–10.8)

## 2020-08-09 LAB — CERVICOVAGINAL ANCILLARY ONLY
Bacterial Vaginitis (gardnerella): POSITIVE — AB
Candida Glabrata: NEGATIVE
Candida Vaginitis: NEGATIVE
Chlamydia: NEGATIVE
Comment: NEGATIVE
Comment: NEGATIVE
Comment: NEGATIVE
Comment: NEGATIVE
Comment: NEGATIVE
Comment: NORMAL
Neisseria Gonorrhea: NEGATIVE
Trichomonas: NEGATIVE

## 2020-08-09 LAB — COMPREHENSIVE METABOLIC PANEL
ALT: 47 IU/L — ABNORMAL HIGH (ref 0–32)
AST: 20 IU/L (ref 0–40)
Albumin/Globulin Ratio: 1.6 (ref 1.2–2.2)
Albumin: 4.3 g/dL (ref 3.8–4.8)
Alkaline Phosphatase: 56 IU/L (ref 44–121)
BUN/Creatinine Ratio: 14 (ref 9–23)
BUN: 8 mg/dL (ref 6–20)
Bilirubin Total: <0.2 mg/dL (ref 0.0–1.2)
CO2: 22 mmol/L (ref 20–29)
Calcium: 8.7 mg/dL (ref 8.7–10.2)
Chloride: 102 mmol/L (ref 96–106)
Creatinine, Ser: 0.57 mg/dL (ref 0.57–1.00)
GFR calc Af Amer: 142 mL/min/{1.73_m2} (ref >59)
GFR calc non Af Amer: 123 mL/min/{1.73_m2} (ref >59)
Globulin, Total: 2.7 g/dL (ref 1.5–4.5)
Glucose: 85 mg/dL (ref 65–99)
Potassium: 4 mmol/L (ref 3.5–5.2)
Sodium: 137 mmol/L (ref 134–144)
Total Protein: 7 g/dL (ref 6.0–8.5)

## 2020-08-09 LAB — PROTEIN / CREATININE RATIO, URINE
Creatinine, Urine: 180.3 mg/dL
Protein, Ur: 11.4 mg/dL
Protein/Creat Ratio: 63 mg/g creat (ref 0–200)

## 2020-08-09 LAB — HCV INTERPRETATION

## 2020-08-09 LAB — HEMOGLOBIN A1C
Est. average glucose Bld gHb Est-mCnc: 100 mg/dL
Hgb A1c MFr Bld: 5.1 % (ref 4.8–5.6)

## 2020-08-10 LAB — URINE CULTURE, OB REFLEX

## 2020-08-10 LAB — CULTURE, OB URINE

## 2020-08-10 MED ORDER — DOXYLAMINE-PYRIDOXINE 10-10 MG PO TBEC: 2.0000 | DELAYED_RELEASE_TABLET | Freq: Every day | ORAL | 2 refills | Status: DC

## 2020-08-11 MED ORDER — METRONIDAZOLE 500 MG PO TABS: 500.0000 mg | ORAL_TABLET | Freq: Two times a day (BID) | ORAL | 0 refills | Status: DC

## 2020-08-11 NOTE — Addendum Note (Signed)
Addended by: Gerrit Heck L on: 08/11/2020 05:26 AM   Modules accepted: Orders

## 2020-08-13 LAB — CYTOLOGY - PAP
Comment: NEGATIVE
Diagnosis: NEGATIVE
High risk HPV: NEGATIVE

## 2020-08-29 ENCOUNTER — Other Ambulatory Visit: Payer: Self-pay

## 2020-08-29 DIAGNOSIS — O99210 Obesity complicating pregnancy, unspecified trimester: Secondary | ICD-10-CM

## 2020-08-29 MED ORDER — ASPIRIN EC 81 MG PO TBEC: 81.0000 mg | DELAYED_RELEASE_TABLET | Freq: Every day | ORAL | 2 refills | Status: DC

## 2020-08-30 ENCOUNTER — Other Ambulatory Visit: Payer: Self-pay

## 2020-08-30 ENCOUNTER — Telehealth: Payer: Self-pay

## 2020-08-30 DIAGNOSIS — O219 Vomiting of pregnancy, unspecified: Secondary | ICD-10-CM

## 2020-08-30 MED ORDER — PROMETHAZINE HCL 25 MG PO TABS: 25.0000 mg | ORAL_TABLET | Freq: Four times a day (QID) | ORAL | 0 refills | Status: DC | PRN

## 2020-08-30 NOTE — Telephone Encounter (Signed)
Return call to pt 13 wks ROBregarding triage vm  B/P reading today 142/88 Pt c/o HA has Hx of migraines, not drink much water and not  eating much due to nausea and vomiting Taking  tylenol and pedialyte . Pt has not started baby asa just yet was picking up today Initial nausea Rx that was sent is not covered under pt Ins I let pt know I would try send phenergan pt advise to contact office if not covered.  Pt advised to increase water intake to avoid dehydration Not let stomach get empty dicussed BLAND DIET  Stop PNV's until feeling better  made aware if no relief may have to report to MAU for IV fluids  Also made pt aware Natera results were not drawn at [facility] due to being only 9wks Pt is going to try all comfort measures we discussed and start ASA. Pt advised to keep all upcoming appts.

## 2020-09-02 ENCOUNTER — Telehealth: Payer: Self-pay

## 2020-09-02 NOTE — Telephone Encounter (Signed)
Received message from baby scripts about a high blood pressure reading this am. Called patient no answer or voice mail to leave a message.

## 2020-09-05 ENCOUNTER — Telehealth: Payer: BC Managed Care – PPO | Admitting: Obstetrics and Gynecology

## 2020-09-18 ENCOUNTER — Other Ambulatory Visit: Payer: Self-pay

## 2020-09-18 ENCOUNTER — Ambulatory Visit (INDEPENDENT_AMBULATORY_CARE_PROVIDER_SITE_OTHER): Payer: BC Managed Care – PPO | Admitting: Nurse Practitioner

## 2020-09-18 VITALS — BP 125/78 | HR 87 | Wt 256.4 lb

## 2020-09-18 DIAGNOSIS — Z3480 Encounter for supervision of other normal pregnancy, unspecified trimester: Secondary | ICD-10-CM | POA: Diagnosis not present

## 2020-09-18 DIAGNOSIS — Z3A15 15 weeks gestation of pregnancy: Secondary | ICD-10-CM

## 2020-09-18 DIAGNOSIS — Z3482 Encounter for supervision of other normal pregnancy, second trimester: Secondary | ICD-10-CM | POA: Diagnosis not present

## 2020-09-18 DIAGNOSIS — O99210 Obesity complicating pregnancy, unspecified trimester: Secondary | ICD-10-CM

## 2020-09-18 DIAGNOSIS — O219 Vomiting of pregnancy, unspecified: Secondary | ICD-10-CM

## 2020-09-18 NOTE — Patient Instructions (Signed)
Check out Covid vaccine info here:   ttps://covid19.ncdhhs.gov/vaccines  7 Things You Should Know About the COVID-19 Vaccines  Here are seven facts you should know before taking your shot:  1.  No serious side effects were reported in clinical trials. Temporary reactions after receiving the vaccine may include a sore arm, headache, feeling tired and achy for a day or two or, in some cases, fever. In most cases, these reactions are good signs that your body is building protection.   2.  Scientists had a head start. They are built on decades of research on vaccines for similar viruses. A big investment of resources and focus made sure they were created without skipping any steps in development, testing, or clinical trials.  3. You cannot get COVID-19 from the vaccine. The vaccine gives your body instructions to make a protein that safely teaches you to make germ-fighting antibodies to fight the real COVID-19.  4.The vaccine protects against the Delta variant. The Delta variant, which is now predominant in [place], is much more contagious than the original virus. Vaccines continue to be remarkably effective in reducing risk of severe disease, hospitalization, and death, even against the Delta variant.  5. A hundred million people in the [place] have already received their COVID-19 vaccine.  6. It works. And once you're fully vaccinated you're protected. The vaccines are proven to help prevent COVID-19 and are effective in preventing hospitalization and death.   7. The vaccine does not affect fertility. Vaccination for those who are pregnant or wanting to become pregnant is recommended by the American College of Obstetricians and Gynecologists (ACOG), the Society for Maternal-Fetal Medicine (SMFM), the American Society for Reproductive Medicine (ASRM), and the Society for Female Reproduction and Urology.  

## 2020-09-18 NOTE — Progress Notes (Signed)
° ° °  Subjective:  [name] is a 33 y.o. G2P1001 at [redacted]w[redacted]d being seen today for ongoing prenatal care.  She is currently monitored for the following issues for this low-risk pregnancy and has Obesity BMI=38.9 and Encounter for supervision of normal pregnancy, antepartum on their problem list.  Patient reports no complaints.  Contractions: Irritability. Vag. Bleeding: None.  Movement: Present. Denies leaking of fluid.   The following portions of the patient's history were reviewed and updated as appropriate: allergies, current medications, past family history, past medical history, past social history, past surgical history and problem list. Problem list updated.  Objective:   Vitals:   09/18/20 0954  BP: 125/78  Pulse: 87  Weight: 256 lb 6.4 oz (116.3 kg)    Fetal Status: Fetal Heart Rate (bpm): 157 Fundal Height: 18 cm Movement: Present     General:  Alert, oriented and cooperative. Patient is in no acute distress.  Skin: Skin is warm and dry. No rash noted.   Cardiovascular: Normal heart rate noted  Respiratory: Normal respiratory effort, no problems with respiration noted  Abdomen: Soft, gravid, appropriate for gestational age. Pain/Pressure: Absent     Pelvic:  Cervical exam deferred        Extremities: Normal range of motion.  Edema: None  Mental Status: Normal mood and affect. Normal behavior. Normal judgment and thought content.   Urinalysis:      Assessment and Plan:  Pregnancy: G2P1001 at [redacted]w[redacted]d  1. Supervision of other normal pregnancy, antepartum Took WB class, will schedule with midwife for reviewing and signing consent Putting BPs in babyscripts - reviewed.  Some "higher" pressures but most are normal.  Advised to bring cuff to next visit to check with our cuff here.  Reviewed guidelines for how to take BP - sit, rest, don't talk while taking BP Reviewed getting second dose Covid vaccine  - got first Phizer in August but was afraid to go back as she was  pregnant.  Reviewed getting second dose.  Safe in pregnancy and is beyond first trimester - does not cause birth defects.  Client to consider but leaning toward getting second dose.  - AFP, Serum, Open Spina Bifida - Genetic Screening  2. Nausea and vomiting during pregnancy Using Phenergan and doing much better with vomiting  3. Obesity in pregnancy Taking low dose aspirin    Preterm labor symptoms and general obstetric precautions including but not limited to vaginal bleeding, contractions, leaking of fluid and fetal movement were reviewed in detail with the patient. Please refer to After Visit Summary for other counseling recommendations.  Return in about 4 weeks (around 10/16/2020) for in person with Midwife for water birth consent.  Nolene Bernheim, RN, MSN, NP-BC Nurse Practitioner, [facility] for [employer], [facility] 09/18/2020 1:03 PM

## 2020-09-18 NOTE — Progress Notes (Signed)
Pt reports fetal movement with occasional irritability.  

## 2020-09-21 LAB — AFP, SERUM, OPEN SPINA BIFIDA
AFP MoM: 1.65
AFP Value: 39.2 ng/mL
Gest. Age on Collection Date: 15 weeks
Maternal Age At EDD: 32.9 yr
OSBR Risk 1 IN: 3696
Test Results:: NEGATIVE
Weight: 256 [lb_av]

## 2020-09-25 ENCOUNTER — Encounter: Payer: Self-pay | Admitting: Nurse Practitioner

## 2020-10-01 ENCOUNTER — Encounter: Payer: Self-pay | Admitting: Nurse Practitioner

## 2020-10-02 ENCOUNTER — Telehealth: Payer: Self-pay | Admitting: *Deleted

## 2020-10-02 NOTE — Telephone Encounter (Signed)
Patient left voice message stating she called [facility] to have her partner to have genetic screening. They told her they need an order form faxed to them. Please give her a call.   Clovis Pu, RN

## 2020-10-11 ENCOUNTER — Ambulatory Visit: Payer: BC Managed Care – PPO

## 2020-10-11 ENCOUNTER — Other Ambulatory Visit: Payer: Self-pay | Admitting: *Deleted

## 2020-10-11 ENCOUNTER — Ambulatory Visit: Payer: BC Managed Care – PPO | Admitting: *Deleted

## 2020-10-11 ENCOUNTER — Other Ambulatory Visit: Payer: Self-pay

## 2020-10-11 ENCOUNTER — Encounter: Payer: Self-pay | Admitting: *Deleted

## 2020-10-11 VITALS — BP 122/70 | HR 98

## 2020-10-11 DIAGNOSIS — R638 Other symptoms and signs concerning food and fluid intake: Secondary | ICD-10-CM

## 2020-10-11 DIAGNOSIS — D563 Thalassemia minor: Secondary | ICD-10-CM | POA: Insufficient documentation

## 2020-10-11 DIAGNOSIS — Z6839 Body mass index (BMI) 39.0-39.9, adult: Secondary | ICD-10-CM | POA: Diagnosis not present

## 2020-10-11 DIAGNOSIS — Z3480 Encounter for supervision of other normal pregnancy, unspecified trimester: Secondary | ICD-10-CM | POA: Diagnosis not present

## 2020-10-11 DIAGNOSIS — O99210 Obesity complicating pregnancy, unspecified trimester: Secondary | ICD-10-CM

## 2020-10-11 DIAGNOSIS — Z6841 Body Mass Index (BMI) 40.0 and over, adult: Secondary | ICD-10-CM | POA: Diagnosis not present

## 2020-10-16 ENCOUNTER — Telehealth (INDEPENDENT_AMBULATORY_CARE_PROVIDER_SITE_OTHER): Payer: BC Managed Care – PPO | Admitting: Advanced Practice Midwife

## 2020-10-16 VITALS — BP 135/76 | HR 94

## 2020-10-16 DIAGNOSIS — Z3A19 19 weeks gestation of pregnancy: Secondary | ICD-10-CM

## 2020-10-16 DIAGNOSIS — Z3482 Encounter for supervision of other normal pregnancy, second trimester: Secondary | ICD-10-CM

## 2020-10-16 DIAGNOSIS — Z3480 Encounter for supervision of other normal pregnancy, unspecified trimester: Secondary | ICD-10-CM

## 2020-10-16 NOTE — Patient Instructions (Signed)

## 2020-10-16 NOTE — Progress Notes (Addendum)
° °  TELEHEALTH OBSTETRICS VISIT ENCOUNTER NOTE  I connected with [name] on 10/16/20 at  8:55 AM EST by MyChart Video at home and verified that I am speaking with the correct person using two identifiers.  Patient is at home, and provider is at the office    I discussed the limitations, risks, security and privacy concerns of performing an evaluation and management service by telephone and the availability of in person appointments. I also discussed with the patient that there may be a patient responsible charge related to this service. The patient expressed understanding and agreed to proceed.  Subjective:  [name] is a 34 y.o. G2P1001 at [redacted]w[redacted]d being followed for ongoing prenatal care.  She is currently monitored for the following issues for this low-risk pregnancy and has Obesity BMI=38.9; Encounter for supervision of normal pregnancy, antepartum; and Alpha thalassemia silent carrier on their problem list.  Patient reports no complaints. Reports fetal movement. Denies any contractions, bleeding or leaking of fluid.   The following portions of the patient's history were reviewed and updated as appropriate: allergies, current medications, past family history, past medical history, past social history, past surgical history and problem list.   Objective:   General:  Alert, oriented and cooperative.   Mental Status: Normal mood and affect perceived. Normal judgment and thought content.  Rest of physical exam deferred due to type of encounter  Assessment and Plan:  Pregnancy: G2P1001 at [redacted]w[redacted]d 1. Supervision of other normal pregnancy, antepartum - will send doula request form to patient   2. [redacted] weeks gestation of pregnancy - planning a water birth - Has taken class    - patient has had 3 days of nasal congestion and cold symptoms. She has tried mucinex, but it is not helping. List of safe medications given. Patient advised to have a covid test done prior to gathering  with family due to symptoms at this time.    Preterm labor symptoms and general obstetric precautions including but not limited to vaginal bleeding, contractions, leaking of fluid and fetal movement were reviewed in detail with the patient.  I discussed the assessment and treatment plan with the patient. The patient was provided an opportunity to ask questions and all were answered. The patient agreed with the plan and demonstrated an understanding of the instructions. The patient was advised to call back or seek an in-person office evaluation/go to [facility] at [facility] for any urgent or concerning symptoms. Please refer to After Visit Summary for other counseling recommendations.   I provided 12 minutes of non-face-to-face time during this encounter.  Return in about 4 weeks (around 11/13/2020).  Future Appointments  Date Time Provider Department Center  11/08/2020  8:30 AM [facility] NURSE [facility]  11/08/2020  8:45 AM [facility]    Thressa Sheller DNP, CNM  10/16/20  9:19 AM

## 2020-10-16 NOTE — Progress Notes (Signed)
S/w pt for virtual visit, reports fetal movement, denies pain. Pt reports that her BP is usually higher with cuff at home than in office.

## 2020-10-26 NOTE — L&D Delivery Note (Signed)
OB/GYN Faculty Practice Delivery Note  [name] is a 35 y.o. G2P1001 s/p Vaginal Delivery at [redacted]w[redacted]d. She was admitted for pPROM at 2300.   ROM: 17h 70m with clear fluid GBS Status: Negative Maximum Maternal Temperature: 99  Labor Progress: . Patient progressed to 9 cm and was started on pitocin for augmentation.  Patient progressed to complete and delivered as below with staff and family support.   Delivery Date/Time: February 13, 2021 at 1619 Delivery: Called to room and patient was complete and pushing. Head delivered LOA. Nuchal cord present and delivered through via somersault manuever. Infant with minimal grimace and flaccid tone.  Infant placed on mother's abdomen, dried and stimulated. Cord clamped after ~ 30 seconds and cut by provider. Cord blood drawn.  Vaginal inspection shows some minor abrasions, but no lacerations.  Fundus firm and placenta remains in utero despite massage.  Pitocin started after 15 minutes and massage continued. After 25 minutes, Dr. Macon Large called to bedside and performed exam.  Reports placenta remains in utero and cervix now ~2cm.  Patient given option of bedside removal or OR removal and opts for OR.   Placenta: Retained Complications: Retained Placenta Lacerations: None EBL: TBD Analgesia: Epidural  Postpartum Planning [ ]  message to sent to schedule follow-up  [ ]  vaccines UTD  Infant: Female-Lyo  APGARs 9/9  3035g, 6lbs 11.1oz, 19.69in  , CNM  02/13/2021 5:21 PM

## 2020-11-08 ENCOUNTER — Ambulatory Visit: Payer: BC Managed Care – PPO | Attending: Obstetrics and Gynecology

## 2020-11-08 ENCOUNTER — Ambulatory Visit: Payer: BC Managed Care – PPO | Admitting: *Deleted

## 2020-11-08 ENCOUNTER — Other Ambulatory Visit: Payer: Self-pay | Admitting: *Deleted

## 2020-11-08 ENCOUNTER — Other Ambulatory Visit: Payer: Self-pay

## 2020-11-08 ENCOUNTER — Encounter: Payer: Self-pay | Admitting: *Deleted

## 2020-11-08 VITALS — BP 122/60 | HR 108

## 2020-11-08 DIAGNOSIS — Z3A22 22 weeks gestation of pregnancy: Secondary | ICD-10-CM

## 2020-11-08 DIAGNOSIS — R638 Other symptoms and signs concerning food and fluid intake: Secondary | ICD-10-CM | POA: Diagnosis not present

## 2020-11-08 DIAGNOSIS — O09292 Supervision of pregnancy with other poor reproductive or obstetric history, second trimester: Secondary | ICD-10-CM | POA: Diagnosis not present

## 2020-11-08 DIAGNOSIS — O321XX0 Maternal care for breech presentation, not applicable or unspecified: Secondary | ICD-10-CM

## 2020-11-08 DIAGNOSIS — O99212 Obesity complicating pregnancy, second trimester: Secondary | ICD-10-CM

## 2020-11-08 DIAGNOSIS — E669 Obesity, unspecified: Secondary | ICD-10-CM | POA: Diagnosis not present

## 2020-11-08 DIAGNOSIS — Z362 Encounter for other antenatal screening follow-up: Secondary | ICD-10-CM

## 2020-11-08 DIAGNOSIS — Z148 Genetic carrier of other disease: Secondary | ICD-10-CM

## 2020-11-13 ENCOUNTER — Telehealth (INDEPENDENT_AMBULATORY_CARE_PROVIDER_SITE_OTHER): Payer: BC Managed Care – PPO | Admitting: Nurse Practitioner

## 2020-11-13 VITALS — BP 123/75 | HR 95

## 2020-11-13 DIAGNOSIS — Z3480 Encounter for supervision of other normal pregnancy, unspecified trimester: Secondary | ICD-10-CM

## 2020-11-13 DIAGNOSIS — Z3A23 23 weeks gestation of pregnancy: Secondary | ICD-10-CM

## 2020-11-13 DIAGNOSIS — Z3482 Encounter for supervision of other normal pregnancy, second trimester: Secondary | ICD-10-CM

## 2020-11-13 NOTE — Progress Notes (Signed)
OBSTETRICS PRENATAL VIRTUAL VISIT ENCOUNTER NOTE  Provider location: [facility] for [facility] [place]   I connected with [name] on 11/13/20 at  8:15 AM EST by MyChart Video Encounter at home and verified that I am speaking with the correct person using two identifiers.   I discussed the limitations, risks, security and privacy concerns of performing an evaluation and management service virtually and the availability of in person appointments. I also discussed with the patient that there may be a patient responsible charge related to this service. The patient expressed understanding and agreed to proceed. Subjective:  [name] is a 35 y.o. G2P1001 at [redacted]w[redacted]d being seen today for ongoing prenatal care.  She is currently monitored for the following issues for this low-risk pregnancy and has Obesity BMI=38.9; Encounter for supervision of normal pregnancy, antepartum; and Alpha thalassemia silent carrier on their problem list.  Patient reports no complaints.  Contractions: Irritability. Vag. Bleeding: None.  Movement: Present. Denies any leaking of fluid.   The following portions of the patient's history were reviewed and updated as appropriate: allergies, current medications, past family history, past medical history, past social history, past surgical history and problem list.   Objective:   Vitals:   11/13/20 0812  BP: 123/75  Pulse: 95    Fetal Status:     Movement: Present     General:  Alert, oriented and cooperative. Patient is in no acute distress.  Respiratory: Normal respiratory effort, no problems with respiration noted  Mental Status: Normal mood and affect. Normal behavior. Normal judgment and thought content.  Rest of physical exam deferred due to type of encounter  Imaging: [place] MFM OB FOLLOW UP  Result Date: 11/08/2020 ----------------------------------------------------------------------  OBSTETRICS REPORT                       (Signed Final  11/08/2020 10:49 am) ---------------------------------------------------------------------- Patient Info  ID #:       [id number]                          D.O.B.:  05-13-1986 (35 yrs)  Name:       [name]               Visit Date: 11/08/2020 08:42 am ---------------------------------------------------------------------- Performed By  Attending:        Noralee Space MD        Ref. Address:     Faculty  Performed By:     Truitt Leep,      [facility]                                                             [facility]  Referred By:      Catalina Antigua MD ---------------------------------------------------------------------- Orders  #  Description                           Code        Ordered By  1  [place] MFM OB FOLLOW UP                   65035.46    Noralee Space ----------------------------------------------------------------------  #  Order #                     Accession #                Episode #  1  [id number]                   [id number]                 [id number] ---------------------------------------------------------------------- Indications  Obesity complicating pregnancy, second         O99.212  trimester (Pregravid BMI 39)  Encounter for other antenatal screening        Z36.2  follow-up  Poor obstetrical history (Hx Covid)            O09.299  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 Cord Insertion:      Visualized, central  Amniotic Fluid  AFI FV:      Subjectively upper-normal                              Largest Pocket(cm)                              8.15 ---------------------------------------------------------------------- Biometry  BPD:        58  mm     G. Age:  23w 5d         89  %    CI:        74.25   %    70 - 86  FL/HC:      18.5   %    18.4 - 20.2  HC:      213.7  mm     G. Age:  23w 3d         77  %    HC/AC:      1.11        1.06 - 1.25  AC:       193   mm     G. Age:  24w 0d         87  %    FL/BPD:     68.3   %    71 - 87  FL:       39.6  mm     G. Age:  22w 5d         50  %    FL/AC:      20.5   %    20 - 24  LV:        5.5  mm  Est. FW:     598  gm      1 lb 5 oz     90  % ---------------------------------------------------------------------- OB History  Gravidity:    2         Term:   1        Prem:   0        SAB:   0  TOP:          0       Ectopic:  0        Living: 1 ---------------------------------------------------------------------- Gestational Age  LMP:           23w 0d        Date:  05/31/20                 EDD:   03/07/21  U/S Today:     23w 3d                                        EDD:   03/04/21  Best:          22w 3d     Det. By:  U/S C R L  (08/01/20)    EDD:   03/11/21 ---------------------------------------------------------------------- Anatomy  Cranium:               Appears normal         LVOT:                   Appears normal  Cavum:                 Appears normal         Aortic Arch:            Appears normal  Ventricles:            Appears normal         Ductal Arch:            Appears normal  Choroid Plexus:        Appears normal         Diaphragm:              Appears normal  Cerebellum:            Appears normal         Stomach:  Appears normal, left                                                                        sided   Posterior Fossa:       Appears normal         Abdomen:                Previously seen  Nuchal Fold:           Not applicable (>20    Abdominal Wall:         Appears nml (cord                         wks GA)                                        insert, abd wall)  Face:                  Appears normal         Cord Vessels:           Previously seen                         (orbits and profile)  Lips:                  Appears normal         Kidneys:                Appear normal  Palate:                Appears normal         Bladder:                Appears normal  Thoracic:              Appears normal         Spine:                  Previously seen  Heart:                 Appears normal         Upper Extremities:      Previously seen                         (4CH, axis, and                         situs)  RVOT:                  Appears normal         Lower Extremities:      Previously seen  Other:  Female gender previously seen. Nasal bone visualized. VC, 3VV and          3VTV visualized. ---------------------------------------------------------------------- Cervix Uterus Adnexa  Cervix  Length:            4.2  cm.  Normal appearance by transabdominal scan. ----------------------------------------------------------------------  Impression  Patient returned for completion of fetal anatomy .Fetal  biometry is consistent with her previously-established dates  .Amniotic fluid is normal and good fetal activity is seen .Fetal  anatomical survey was completed (cardiac anatomy) and  appears normal.  Maternal obesity imposes limitations on the resolution of  images, and failure to detect fetal anomalies is more common  in obese pregnant women. As maternal obesity makes  clinical assessment of fetal growth difficult, we recommend  serial growth scans until delivery. ---------------------------------------------------------------------- Recommendations  -An appointment was made for her to return in 4 weeks for  fetal growth  assessment. ----------------------------------------------------------------------                  Noralee Spaceavi Shankar, MD Electronically Signed Final Report   11/08/2020 10:49 am ----------------------------------------------------------------------   Assessment and Plan:  Pregnancy: G2P1001 at 325w5d 1. Supervision of other normal pregnancy, antepartum Vomiting has improved  Discussed next visit and need to be NPO after midnight for glucola testing Anatomy scan completed and MFM recommends serial growth scans due to obesity - scheduled Has BP cuff - reviewed BP that is too high 140/90 - either value Considering getting her second covid vaccine  Preterm labor symptoms and general obstetric precautions including but not limited to vaginal bleeding, contractions, leaking of fluid and fetal movement were reviewed in detail with the patient. I discussed the assessment and treatment plan with the patient. The patient was provided an opportunity to ask questions and all were answered. The patient agreed with the plan and demonstrated an understanding of the instructions. The patient was advised to call back or seek an in-person office evaluation/[facility] for any urgent or concerning symptoms. Please refer to After Visit Summary for other counseling recommendations.   I provided 5 minutes of face-to-face time during this encounter.  Return in about 4 weeks (around 12/11/2020) for earlly am for glucola testing and ROB - midwife preferred.  Future Appointments  Date Time Provider Department Center  12/06/2020  7:15 AM [facility]  12/06/2020  7:30 AM [facility]  12/11/2020  8:15 AM [facility] LAB [facility] None  12/11/2020  8:35 AM Gerrit HeckEmly, Jessica, CNM [facility] None    Currie Pariserri L Eliza Green, NP [facility]

## 2020-11-13 NOTE — Progress Notes (Signed)
I connected with  [name] on 11/13/20 by a video enabled telemedicine application and verified that I am speaking with the correct person using two identifiers.   I discussed the limitations of evaluation and management by telemedicine. The patient expressed understanding and agreed to proceed.   MyChart OB, reports no problems today

## 2020-11-28 ENCOUNTER — Encounter (HOSPITAL_COMMUNITY): Payer: Self-pay | Admitting: Obstetrics & Gynecology

## 2020-11-28 ENCOUNTER — Other Ambulatory Visit: Payer: Self-pay

## 2020-11-28 ENCOUNTER — Inpatient Hospital Stay (HOSPITAL_COMMUNITY)
Admission: AD | Admit: 2020-11-28 | Discharge: 2020-11-28 | Disposition: A | Discharge status: Home / Self Care | Payer: BC Managed Care – PPO | Attending: Obstetrics & Gynecology | Admitting: Obstetrics & Gynecology

## 2020-11-28 DIAGNOSIS — W19XXXA Unspecified fall, initial encounter: Secondary | ICD-10-CM

## 2020-11-28 DIAGNOSIS — N858 Other specified noninflammatory disorders of uterus: Secondary | ICD-10-CM

## 2020-11-28 DIAGNOSIS — Z7982 Long term (current) use of aspirin: Secondary | ICD-10-CM | POA: Diagnosis not present

## 2020-11-28 DIAGNOSIS — Z3A25 25 weeks gestation of pregnancy: Secondary | ICD-10-CM | POA: Diagnosis not present

## 2020-11-28 DIAGNOSIS — S93401A Sprain of unspecified ligament of right ankle, initial encounter: Secondary | ICD-10-CM

## 2020-11-28 DIAGNOSIS — S80212A Abrasion, left knee, initial encounter: Secondary | ICD-10-CM

## 2020-11-28 DIAGNOSIS — O368120 Decreased fetal movements, second trimester, not applicable or unspecified: Secondary | ICD-10-CM | POA: Insufficient documentation

## 2020-11-28 DIAGNOSIS — S8002XA Contusion of left knee, initial encounter: Secondary | ICD-10-CM | POA: Diagnosis not present

## 2020-11-28 DIAGNOSIS — W109XXA Fall (on) (from) unspecified stairs and steps, initial encounter: Secondary | ICD-10-CM | POA: Diagnosis not present

## 2020-11-28 DIAGNOSIS — O9A212 Injury, poisoning and certain other consequences of external causes complicating pregnancy, second trimester: Secondary | ICD-10-CM | POA: Insufficient documentation

## 2020-11-28 DIAGNOSIS — S96911A Strain of unspecified muscle and tendon at ankle and foot level, right foot, initial encounter: Secondary | ICD-10-CM

## 2020-11-28 DIAGNOSIS — O99891 Other specified diseases and conditions complicating pregnancy: Secondary | ICD-10-CM | POA: Diagnosis not present

## 2020-11-28 IMAGING — US US OB LIMITED
1 series · 9 of 9 positions shown · non-contrast
Comparison: none

[Series 1: us ob limited · 9 of 9 slices shown]
[im 1/9]
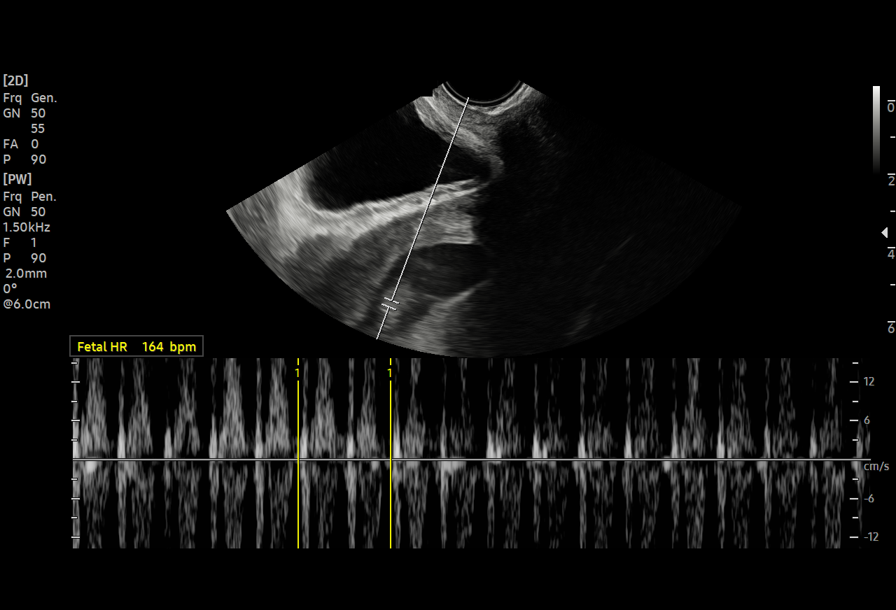
[im 2/9]
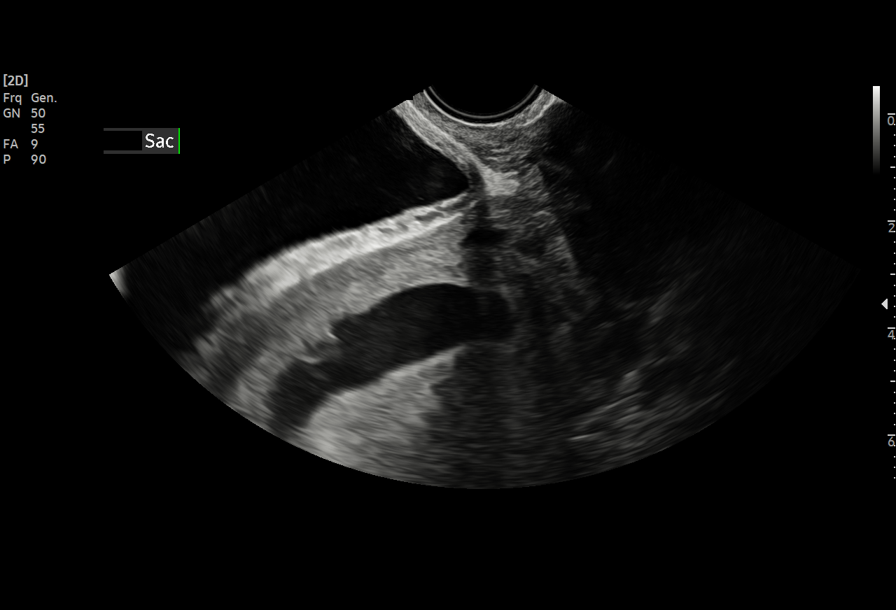
[im 3/9]
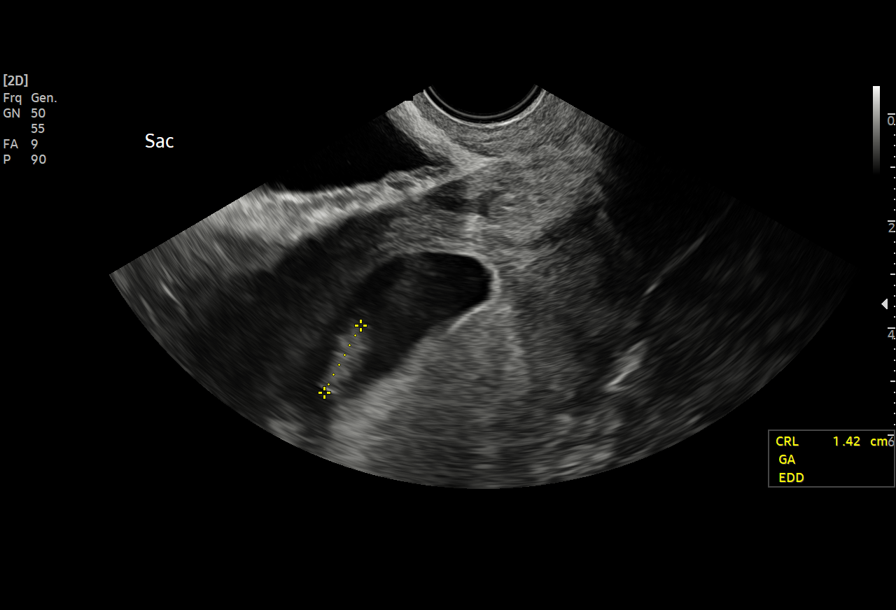
[im 4/9]
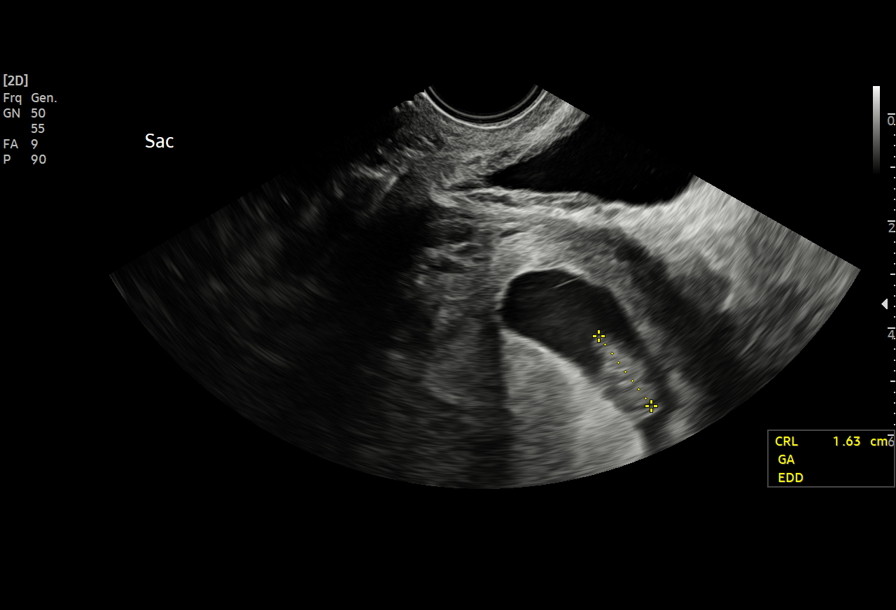
[im 5/9]
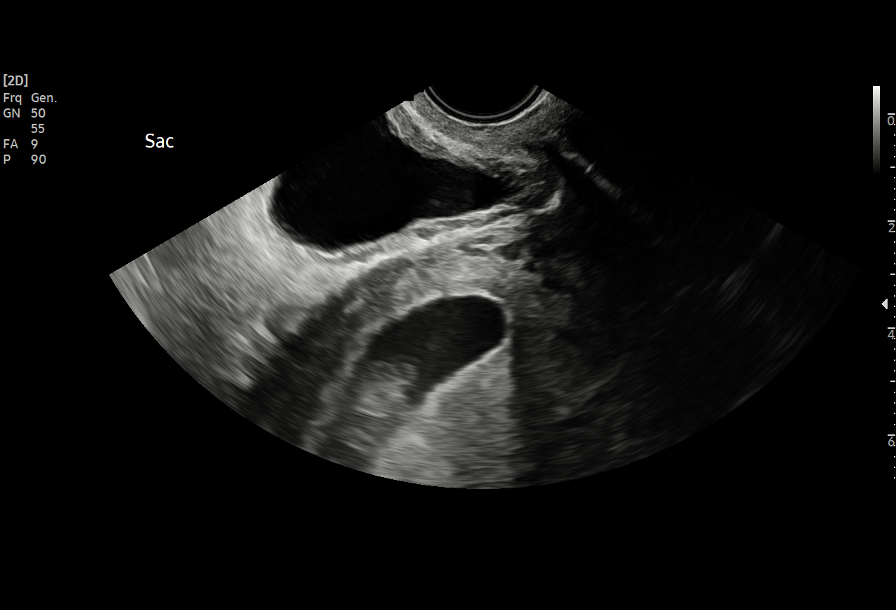
[im 6/9]
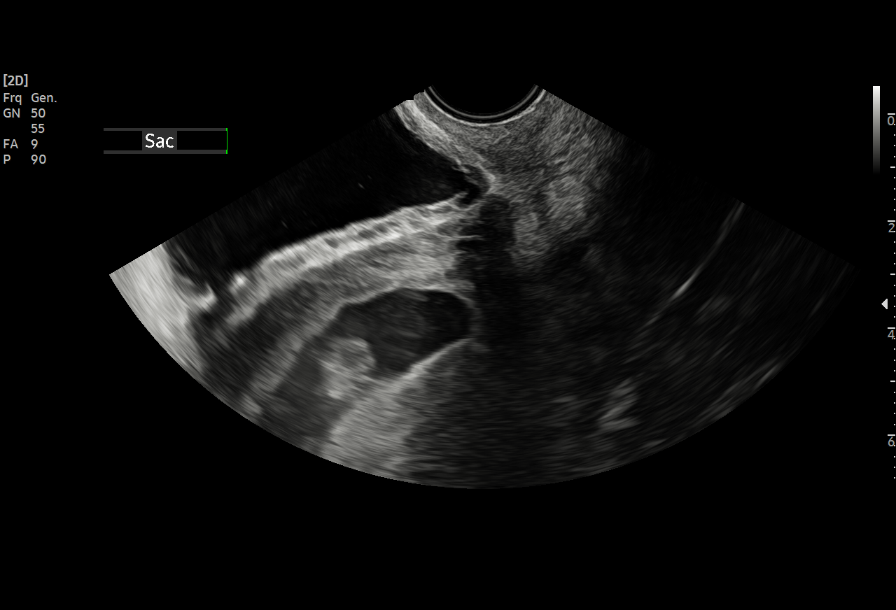
[im 7/9]
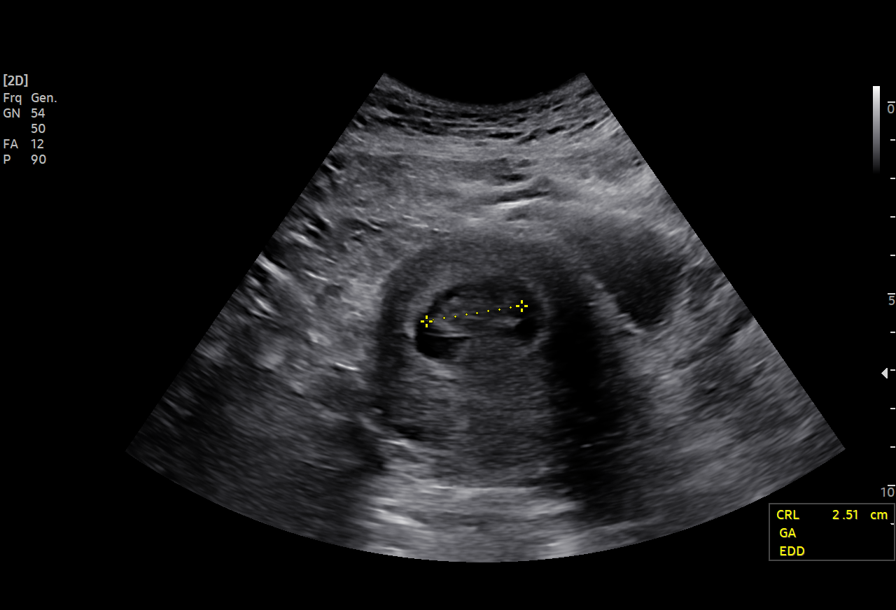
[im 8/9]
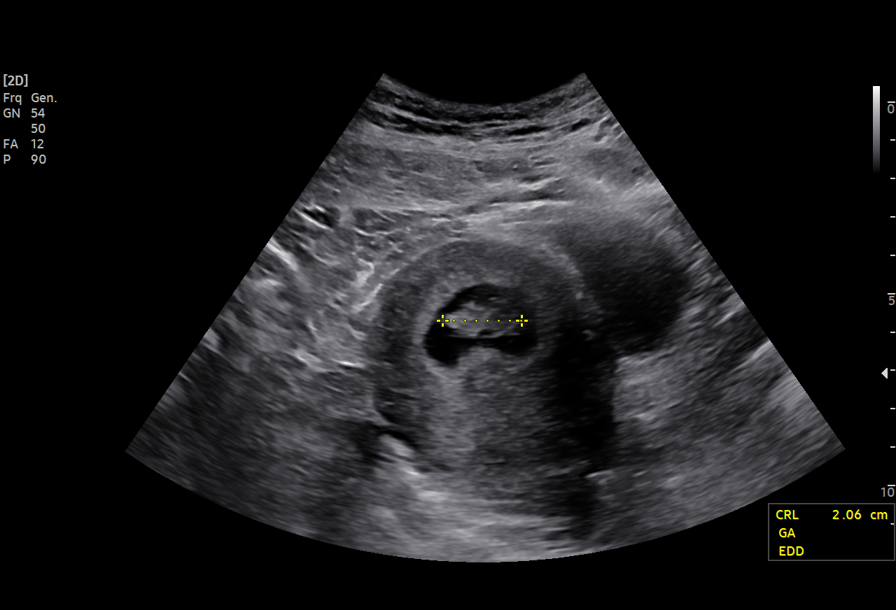
[im 9/9]
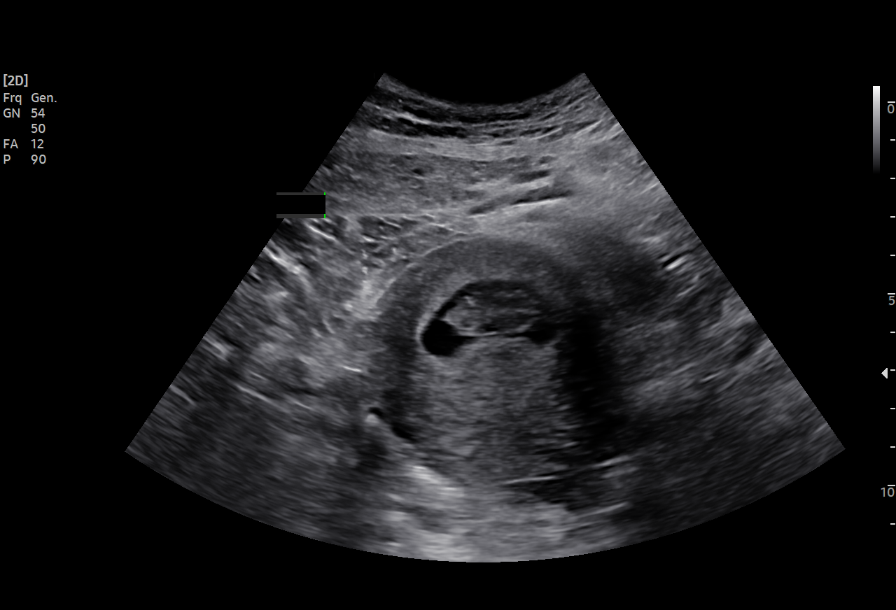

[9 of 9 positions shown; findings below may reference images not displayed]

Healthcare

 1   [HOSPITAL]                        [address].0      POLIN BILLIOT

Indications

 8 weeks gestation of pregnancy
 Pregnancy with inconclusive fetal viability

Fetal Evaluation

 Num Of Fetuses:          1
 Fetal Heart Rate(bpm):   164
 Cardiac Activity:        Observed
Biometry

 CRL:      19.4   mm     G. Age:  8w 2d                    EDD:   03/11/21
Gestational Age

 LMP:            8w 4d        Date:  06/02/20                   EDD:  03/09/21
 Best:           8w 2d     Det. By:  U/S C R L (08/01/20)       EDD:  03/11/21
Comments

 Single live IUP at 8w2d by CRL. FHR 164
Impression

 Viable IUP
Recommendations

 Routine prenatal care
                 Foreign, Kenji

## 2020-11-28 MED ORDER — BACITRACIN-NEOMYCIN-POLYMYXIN OINTMENT TUBE
TOPICAL_OINTMENT | Freq: Every day | CUTANEOUS | Status: DC
  Filled 2020-11-28: qty 14

## 2020-11-28 NOTE — MAU Provider Note (Signed)
Chief Complaint:  Decreased Fetal Movement   Event Date/Time   First Provider Initiated Contact with Patient 11/28/20 [phone]     HPI: [name] is a 35 y.o. G2P1001 at 75w6dho presents to maternity admissions reporting falling after missing a step last night (12 hours ago).  Feels less movement from baby..Twisted right ankle and skinned left knee.   She reports good fetal movement now, denies LOF, vaginal bleeding, vaginal itching/burning, urinary symptoms, h/a, dizziness, n/v, diarrhea, constipation or fever/chills.   Other This is a new problem. The current episode started yesterday. The problem has been unchanged. Pertinent negatives include no abdominal pain, chills, fever, headaches, myalgias or nausea. Associated symptoms comments: Right ankle soreness, left knee abrasion . Nothing aggravates the symptoms. She has tried nothing for the symptoms.    RN Note: Patient reports to MAU stating she missed a step and fell around 1900 yesterday.  She was carrying water jug that her stomach fell against when she fell.  States baby has been moving less than usual.  Denies leaking of fluids or bleeding.  Past Medical History: Past Medical History:  Diagnosis Date  . Migraine     Past obstetric history: OB History  Gravida Para Term Preterm AB Living  '2 1 1 ' 0 0 1  SAB IAB Ectopic Multiple Live Births  0 0 0 0 1    # Outcome Date GA Lbr Len/2nd Weight Sex Delivery Anes PTL Lv  2 Current           1 Term 05/03/07    F Vag-Spont   LIV    Past Surgical History: Past Surgical History:  Procedure Laterality Date  . WISDOM TOOTH EXTRACTION      Family History: Family History  Problem Relation Age of Onset  . Breast cancer Mother   . Heart disease Father   . Breast cancer Maternal Grandmother     Social History: Social History   Tobacco Use  . Smoking status: Never Smoker  . Smokeless tobacco: Never Used  Vaping Use  . Vaping Use: Never used  Substance Use Topics  .  Alcohol use: Not Currently    Comment: Last drink 05/2020  . Drug use: No    Allergies: No Known Allergies  Meds:  Medications Prior to Admission  Medication Sig Dispense Refill Last Dose  . aspirin EC 81 MG tablet Take 1 tablet (81 mg total) by mouth daily. 60 tablet 2 11/27/2020 at Unknown time  . Prenatal MV-Min-FA-Omega-3 (PRENATAL GUMMIES/DHA & FA) 0.4-32.5 MG CHEW Chew by mouth.   11/27/2020 at Unknown time  . Blood Pressure Monitoring (BLOOD PRESSURE KIT) KIT 1 kit by Does not apply route once a week. 1 kit 0   . Doxylamine-Pyridoxine 10-10 MG TBEC Take 2 tablets by mouth at bedtime. 60 tablet 2   . Probiotic Product (PROBIOTIC-10) CHEW Chew by mouth.       I have reviewed patient's Past Medical Hx, Surgical Hx, Family Hx, Social Hx, medications and allergies.   ROS:  Review of Systems  Constitutional: Negative for chills and fever.  Gastrointestinal: Negative for abdominal pain and nausea.  Musculoskeletal: Negative for myalgias.  Neurological: Negative for headaches.   Other systems negative  Physical Exam   Patient Vitals for the past 24 hrs:  BP Temp Temp src Pulse Resp SpO2 Height  11/28/20 0644 120/72 98.6 F (37 C) Oral (!) 115 18 100 % '5\' 5"'  (1.651 m)   Constitutional: Well-developed, well-nourished female in no acute  distress.  Cardiovascular: normal rate and rhythm Respiratory: normal effort,  GI: Abd soft, non-tender, gravid appropriate for gestational age.   No rebound or guarding. MS: Extremities nontender, no edema, normal ROM   Right ankle sore, no swelling or bruising.  Left knee abrasion.   Neurologic: Alert and oriented x 4.  GU: Neg CVAT.  Cervix long and closed  FHT:  Baseline 150 , moderate variability, accelerations present, no decelerations Contractions: Intermittent irritabilty   Labs: No results found for this or any previous visit (from the past 24 hour(s)). A/Positive/-- (10/14 1324)  Imaging:    MAU Course/MDM: NST reviewed,  reassuring for gestational age  Treatments in MAU included Neosporin and dressing to left knee.  Recommend icing right ankle and left knee.    Assessment: Single IUP at 30w6dS/p fall Uterine irritability Left knee contusion Right ankle strain  Plan: Discharge home Preterm Labor precautions and fetal kick counts Follow up in Office for prenatal visits   Encouraged to return if she develops worsening of symptoms, increase in pain, fever, or other concerning symptoms.  Work note provided Pt stable at time of discharge.  MHansel FeinsteinCNM, MSN Certified Nurse-Midwife 11/28/2020 6:52 AM

## 2020-11-28 NOTE — Discharge Instructions (Signed)
Abrasion An abrasion is a cut or a scrape on your skin. You must take care of your wound so germs do not get in it and cause infection. What are the causes? This condition is caused by rubbing your skin on something or falling on a surface, such as the ground. When your skin rubs on something, some layers of skin may rub off. What are the signs or symptoms?  A cut or a scrape.  Bleeding.  A red or pink spot.  A bruise under your wound. How is this treated? This condition may be treated with:  Cleaning your wound.  Putting ointment on your wound.  Putting a bandage on your wound.  Getting a tetanus shot. Follow these instructions at home: Your doctor may tell you to do these things: Medicines  Take or use over-the-counter and prescription medicines only as told by your doctor.  If you were prescribed an antibiotic medicine, use it as told by your doctor. Do not stop using it even if you start to feel better. Keep your wound clean  Clean your wound 1 or 2 times a day or as often told by your doctor. To do this: 1. Wash your hands for at least 20 seconds with mild soap and water. Do this before and after you clean your wound. 2. Wash your wound with mild soap and water. 3. Rinse off the soap. 4. Pat your wound with a clean towel to dry it. Do not rub your wound.  Keep your bandage clean and dry. Take it off and change it as told by your doctor. ? You may have to change your bandage one or more times a day, or as told by your doctor. Watch for signs of infection Check your wound every day for signs of infection. Check for:  A red streak that goes away from your wound.  Other redness.  Swelling or more pain.  Warmth.  Blood, fluid, pus, or a bad smell. Treat pain and swelling  If told, put ice on the injured area. To do this: ? Put ice in a plastic bag. ? Place a towel between your skin and the bag. ? Leave the ice on for 20 minutes, 2-3 times a day. ? Take off  the ice if your skin turns bright red. This is very important. If you cannot feel pain, heat, or cold, you have a greater risk of damage to the area.  If you can, raise the injured area above the level of your heart while you are sitting or lying down.   General instructions  Do not take baths, swim, or use a hot tub. Ask your doctor about taking showers or sponge baths.  Keep all follow-up visits. Contact a doctor if:  You had a tetanus shot, and you have any of these where the needle went in: ? Swelling. ? Very bad pain. ? Redness. ? Bleeding.  You have a lot of pain, and medicine does not help.  You have a fever.  You have any of these signs of infection in your wound: ? Redness, swelling, or more pain. ? Blood, fluid, pus, or a bad smell. ? Warmth. Get help right away if:  You have a red streak going away from your wound. Summary  An abrasion is a cut or a scrape on your skin. Take care of your wound so germs do not get in it.  Clean your wound 1 or 2 times a day or as often as told. Change   your bandage as told and use medicines as told.  Call your doctor if you have a fever or if you have redness, swelling, or more pain in your wound.  Call your doctor if you have blood, fluid, pus, or a bad smell in your wound.  Get help right away if you have a red streak going away from your wound. This information is not intended to replace advice given to you by your health care provider. Make sure you discuss any questions you have with your health care provider. Document Revised: 01/11/2020 Document Reviewed: 01/11/2020 Elsevier Patient Education  2021 [employer] Inc.   Ankle Sprain  An ankle sprain is a stretch or tear in one of the tough tissues (ligaments) that connect the bones in your ankle. An ankle sprain can happen when the ankle rolls outward (inversion sprain) or inward (eversion sprain). What are the causes? This condition is caused by rolling or twisting the  ankle. What increases the risk? You are more likely to develop this condition if you play sports. What are the signs or symptoms? Symptoms of this condition include:  Pain in your ankle.  Swelling.  Bruising. This may happen right after you sprain your ankle or 1-2 days later.  Trouble standing or walking. How is this diagnosed? This condition is diagnosed with:  A physical exam. During the exam, your doctor will press on certain parts of your foot and ankle and try to move them in certain ways.  X-ray imaging. These may be taken to see how bad the sprain is and to check for broken bones. How is this treated? This condition may be treated with:  A brace or splint. This is used to keep the ankle from moving until it heals.  An elastic bandage. This is used to support the ankle.  Crutches.  Pain medicine.  Surgery. This may be needed if the sprain is very bad.  Physical therapy. This may help to improve movement in the ankle. Follow these instructions at home: If you have a brace or a splint:  Wear the brace or splint as told by your doctor. Remove it only as told by your doctor.  Loosen the brace or splint if your toes: ? Tingle. ? Lose feeling (become numb). ? Turn cold and blue.  Keep the brace or splint clean.  If the brace or splint is not waterproof: ? Do not let it get wet. ? Cover it with a watertight covering when you take a bath or a shower. If you have an elastic bandage (dressing):  Remove it to shower or bathe.  Try not to move your ankle much, but wiggle your toes from time to time. This helps to prevent swelling.  Adjust the dressing if it feels too tight.  Loosen the dressing if your foot: ? Loses feeling. ? Tingles. ? Becomes cold and blue. Managing pain, stiffness, and swelling  Take over-the-counter and prescription medicines only as told by doctor.  For 2-3 days, keep your ankle raised (elevated) above the level of your heart.  If  told, put ice on the injured area: ? If you have a removable brace or splint, remove it as told by your doctor. ? Put ice in a plastic bag. ? Place a towel between your skin and the bag. ? Leave the ice on for 20 minutes, 2-3 times a day.   General instructions  Rest your ankle.  Do not use your injured leg to support your body weight until your  doctor says that you can. Use crutches as told by your doctor.  Do not use any products that contain nicotine or tobacco, such as cigarettes, e-cigarettes, and chewing tobacco. If you need help quitting, ask your doctor.  Keep all follow-up visits as told by your doctor. Contact a doctor if:  Your bruises or swelling are quickly getting worse.  Your pain does not get better after you take medicine. Get help right away if:  You cannot feel your toes or foot.  Your foot or toes look blue.  You have very bad pain that gets worse. Summary  An ankle sprain is a stretch or tear in one of the tough tissues (ligaments) that connect the bones in your ankle.  This condition is caused by rolling or twisting the ankle.  Symptoms include pain, swelling, bruising, and trouble walking.  To help with pain and swelling, put ice on the injured ankle, raise your ankle above the level of your heart, and use an elastic bandage. Also, rest as told by your doctor.  Keep all follow-up visits as told by your doctor. This is important. This information is not intended to replace advice given to you by your health care provider. Make sure you discuss any questions you have with your health care provider. Document Revised: 03/08/2018 Document Reviewed: 03/08/2018 Elsevier Patient Education  2021 [employer].

## 2020-11-28 NOTE — MAU Note (Signed)
Patient reports to MAU stating she missed a step and fell around 1900 yesterday.  She was carrying water jug that her stomach fell against when she fell.  States baby has been moving less than usual.  Denies leaking of fluids or bleeding.

## 2020-12-06 ENCOUNTER — Other Ambulatory Visit: Payer: Self-pay | Admitting: *Deleted

## 2020-12-06 ENCOUNTER — Ambulatory Visit: Payer: BC Managed Care – PPO | Attending: Obstetrics and Gynecology

## 2020-12-06 ENCOUNTER — Other Ambulatory Visit: Payer: Self-pay

## 2020-12-06 ENCOUNTER — Encounter: Payer: Self-pay | Admitting: *Deleted

## 2020-12-06 ENCOUNTER — Ambulatory Visit: Payer: BC Managed Care – PPO | Admitting: *Deleted

## 2020-12-06 DIAGNOSIS — O98512 Other viral diseases complicating pregnancy, second trimester: Secondary | ICD-10-CM | POA: Diagnosis not present

## 2020-12-06 DIAGNOSIS — Z3A26 26 weeks gestation of pregnancy: Secondary | ICD-10-CM

## 2020-12-06 DIAGNOSIS — R638 Other symptoms and signs concerning food and fluid intake: Secondary | ICD-10-CM

## 2020-12-06 DIAGNOSIS — Z8616 Personal history of COVID-19: Secondary | ICD-10-CM

## 2020-12-06 DIAGNOSIS — Z148 Genetic carrier of other disease: Secondary | ICD-10-CM

## 2020-12-06 DIAGNOSIS — O99212 Obesity complicating pregnancy, second trimester: Secondary | ICD-10-CM | POA: Diagnosis not present

## 2020-12-06 DIAGNOSIS — O352XX0 Maternal care for (suspected) hereditary disease in fetus, not applicable or unspecified: Secondary | ICD-10-CM

## 2020-12-11 ENCOUNTER — Ambulatory Visit (INDEPENDENT_AMBULATORY_CARE_PROVIDER_SITE_OTHER): Payer: BC Managed Care – PPO

## 2020-12-11 ENCOUNTER — Other Ambulatory Visit: Payer: Self-pay

## 2020-12-11 ENCOUNTER — Other Ambulatory Visit: Payer: BC Managed Care – PPO

## 2020-12-11 VITALS — BP 121/75 | HR 105 | Wt 267.0 lb

## 2020-12-11 DIAGNOSIS — Z3A27 27 weeks gestation of pregnancy: Secondary | ICD-10-CM | POA: Diagnosis not present

## 2020-12-11 DIAGNOSIS — Z3480 Encounter for supervision of other normal pregnancy, unspecified trimester: Secondary | ICD-10-CM

## 2020-12-11 DIAGNOSIS — R7401 Elevation of levels of liver transaminase levels: Secondary | ICD-10-CM

## 2020-12-11 DIAGNOSIS — O99210 Obesity complicating pregnancy, unspecified trimester: Secondary | ICD-10-CM

## 2020-12-11 NOTE — Progress Notes (Signed)
LOW-RISK PREGNANCY OFFICE VISIT  Patient name: [name] MRN [id number]  Date of birth: 04/15/1986 Chief Complaint:   Routine Prenatal Visit  Subjective:   [name] is a 35 y.o. G72P1001 female at [redacted]w[redacted]d with an Estimated Date of Delivery: 03/07/21 being seen today for ongoing management of a low-risk pregnancy aeb has Obesity BMI=38.9; Encounter for supervision of normal pregnancy, antepartum; and Alpha thalassemia silent carrier on their problem list.  Patient presents today with complaint of pelvic and thigh pain.  Patient reports she is unable to describe the pain, but then reports it is "like an ache."  Patient states the pain is only present in the morning. Patient endorses fetal movement.  Patient denies vaginal concerns including abnormal discharge, leaking of fluid, and bleeding. She states she has taken her WB class, but has not sent in her certificate Contractions: Not present. Vag. Bleeding: None.  Movement: Present.  Reviewed past medical,surgical, social, obstetrical and family history as well as problem list, medications and allergies.  Objective   Vitals:   12/11/20 0839  BP: 121/75  Pulse: (!) 105  Weight: 267 lb (121.1 kg)  Body mass index is 44.43 kg/m.  Total Weight Gain:32 lb (14.5 kg)         Physical Examination:   General appearance: Well appearing, and in no distress  Mental status: Alert, oriented to person, place, and time  Skin: Warm & dry  Cardiovascular: Normal heart rate noted  Respiratory: Normal respiratory effort, no distress  Abdomen: Soft, gravid, nontender, LGA with Fundal height of Fundal Height: 32 cm  Pelvic: Cervical exam deferred           Extremities: Edema: None  Fetal Status: Fetal Heart Rate (bpm): 152  Movement: Present   No results found for this or any previous visit (from the past 24 hour(s)).  Assessment & Plan:  Low-risk pregnancy of a 35 y.o., G2P1001 at [redacted]w[redacted]d with an Estimated Date of Delivery: 03/07/21    1. Supervision of other normal pregnancy, antepartum -Reviewed blood draw procedures and labs which also include check of iron level.  -Discussed how results of GTT are handled including diabetic education and BS testing for abnormal results and routine care for normal results.   2. [redacted] weeks gestation of pregnancy -Reassured that pelvic pain normal during this stage of pregnancy. -Discussed c/o of intermittent sciatica pain.  -Encouraged morning stretches prior to getting out of bed. -Instructed to scan in [facility] certificate.  3. Elevated ALT measurement -NOB with elevated ALT of 42. NR 0-32 -Will repeat LFTs today with other labs.   4. Obesity in pregnancy -Taking bASA daily.      Meds: No orders of the defined types were placed in this encounter.  Labs/procedures today:   Lab Orders     Glucose Tolerance, 2 Hours w/1 Hour     RPR     HIV antibody (with reflex)     CBC     Hepatic function panel   Reviewed: Preterm labor symptoms and general obstetric precautions including but not limited to vaginal bleeding, contractions, leaking of fluid and fetal movement were reviewed in detail with the patient.  All questions were answered.  Follow-up: Return in about 3 weeks (around 01/01/2021) for LROB.  Orders Placed This Encounter  Procedures  . Glucose Tolerance, 2 Hours w/1 Hour  . RPR  . HIV antibody (with reflex)  . CBC  . Hepatic function panel   Cherre Robins MSN, CNM 12/11/2020

## 2020-12-11 NOTE — Progress Notes (Addendum)
ROB/GTT.  Declined FLU and TDAP vaccines.  Wants Rx for Breast Pump, info given to Pt. C/o pelvic and thigh pain.

## 2020-12-12 LAB — HEPATIC FUNCTION PANEL
ALT: 32 IU/L (ref 0–32)
AST: 15 IU/L (ref 0–40)
Albumin: 3.6 g/dL — ABNORMAL LOW (ref 3.8–4.8)
Alkaline Phosphatase: 76 IU/L (ref 44–121)
Bilirubin Total: <0.2 mg/dL (ref 0.0–1.2)
Bilirubin, Direct: <0.1 mg/dL (ref 0.00–0.40)
Total Protein: 6.1 g/dL (ref 6.0–8.5)

## 2020-12-12 LAB — CBC
Hematocrit: 34.8 % (ref 34.0–46.6)
Hemoglobin: 11.8 g/dL (ref 11.1–15.9)
MCH: 27.4 pg (ref 26.6–33.0)
MCHC: 33.9 g/dL (ref 31.5–35.7)
MCV: 81 fL (ref 79–97)
Platelets: 208 10*3/uL (ref 150–450)
RBC: 4.31 x10E6/uL (ref 3.77–5.28)
RDW: 12.6 % (ref 11.7–15.4)
WBC: 8.4 10*3/uL (ref 3.4–10.8)

## 2020-12-12 LAB — GLUCOSE TOLERANCE, 2 HOURS W/ 1HR
Glucose, 1 hour: 142 mg/dL (ref 65–179)
Glucose, 2 hour: 90 mg/dL (ref 65–152)
Glucose, Fasting: 89 mg/dL (ref 65–91)

## 2020-12-12 LAB — HIV ANTIBODY (ROUTINE TESTING W REFLEX): HIV Screen 4th Generation wRfx: NONREACTIVE

## 2020-12-12 LAB — RPR: RPR Ser Ql: NONREACTIVE

## 2021-01-01 ENCOUNTER — Encounter: Payer: Self-pay | Admitting: Obstetrics and Gynecology

## 2021-01-01 ENCOUNTER — Other Ambulatory Visit (HOSPITAL_COMMUNITY)
Admission: RE | Admit: 2021-01-01 | Discharge: 2021-01-01 | Disposition: A | Discharge status: Home / Self Care | Payer: BC Managed Care – PPO | Source: Ambulatory Visit | Attending: Obstetrics and Gynecology | Admitting: Obstetrics and Gynecology

## 2021-01-01 ENCOUNTER — Other Ambulatory Visit: Payer: Self-pay

## 2021-01-01 ENCOUNTER — Ambulatory Visit (INDEPENDENT_AMBULATORY_CARE_PROVIDER_SITE_OTHER): Payer: BC Managed Care – PPO | Admitting: Obstetrics and Gynecology

## 2021-01-01 VITALS — BP 127/77 | HR 99 | Wt 272.4 lb

## 2021-01-01 DIAGNOSIS — Z3A30 30 weeks gestation of pregnancy: Secondary | ICD-10-CM | POA: Insufficient documentation

## 2021-01-01 DIAGNOSIS — B373 Candidiasis of vulva and vagina: Secondary | ICD-10-CM | POA: Diagnosis not present

## 2021-01-01 DIAGNOSIS — N898 Other specified noninflammatory disorders of vagina: Secondary | ICD-10-CM | POA: Insufficient documentation

## 2021-01-01 DIAGNOSIS — O26893 Other specified pregnancy related conditions, third trimester: Secondary | ICD-10-CM

## 2021-01-01 DIAGNOSIS — D563 Thalassemia minor: Secondary | ICD-10-CM

## 2021-01-01 DIAGNOSIS — O23593 Infection of other part of genital tract in pregnancy, third trimester: Secondary | ICD-10-CM | POA: Diagnosis not present

## 2021-01-01 DIAGNOSIS — Z113 Encounter for screening for infections with a predominantly sexual mode of transmission: Secondary | ICD-10-CM | POA: Diagnosis not present

## 2021-01-01 DIAGNOSIS — Z3480 Encounter for supervision of other normal pregnancy, unspecified trimester: Secondary | ICD-10-CM

## 2021-01-01 DIAGNOSIS — E669 Obesity, unspecified: Secondary | ICD-10-CM

## 2021-01-01 NOTE — Progress Notes (Signed)
Patient presents for ROB. Patient states that her partner has noticed some itching and skin peeling after having intercourse. Patient states that she has not noticed any abnormal symptoms, but does state that she has an increase in discharge. Request for yeast and bv check. Self swab performed.

## 2021-01-01 NOTE — Patient Instructions (Signed)
https://www.nichd.nih.gov/health/topics/labor-delivery/Pages/default.aspx">  Third Trimester of Pregnancy  The third trimester of pregnancy is from week 28 through week 40. This is months 7 through 9. The third trimester is a time when the unborn baby (fetus) is growing rapidly. At the end of the ninth month, the fetus is about 20 inches long and weighs 6-10 pounds. Body changes during your third trimester During the third trimester, your body will continue to go through many changes. The changes vary and generally return to normal after your baby is born. Physical changes  Your weight will continue to increase. You can expect to gain 25-35 pounds (11-16 kg) by the end of the pregnancy if you begin pregnancy at a normal weight. If you are underweight, you can expect to gain 28-40 lb (about 13-18 kg), and if you are overweight, you can expect to gain 15-25 lb (about 7-11 kg).  You may begin to get stretch marks on your hips, abdomen, and breasts.  Your breasts will continue to grow and may hurt. A yellow fluid (colostrum) may leak from your breasts. This is the first milk you are producing for your baby.  You may have changes in your hair. These can include thickening of your hair, rapid growth, and changes in texture. Some people also have hair loss during or after pregnancy, or hair that feels dry or thin.  Your belly button may stick out.  You may notice more swelling in your hands, face, or ankles. Health changes  You may have heartburn.  You may have constipation.  You may develop hemorrhoids.  You may develop swollen, bulging veins (varicose veins) in your legs.  You may have increased body aches in the pelvis, back, or thighs. This is due to weight gain and increased hormones that are relaxing your joints.  You may have increased tingling or numbness in your hands, arms, and legs. The skin on your abdomen may also feel numb.  You may feel short of breath because of your  expanding uterus. Other changes  You may urinate more often because the fetus is moving lower into your pelvis and pressing on your bladder.  You may have more problems sleeping. This may be caused by the size of your abdomen, an increased need to urinate, and an increase in your body's metabolism.  You may notice the fetus "dropping," or moving lower in your abdomen (lightening).  You may have increased vaginal discharge.  You may notice that you have pain around your pelvic bone as your uterus distends. Follow these instructions at home: Medicines  Follow your health care provider's instructions regarding medicine use. Specific medicines may be either safe or unsafe to take during pregnancy. Do not take any medicines unless approved by your health care provider.  Take a prenatal vitamin that contains at least 600 micrograms (mcg) of folic acid. Eating and drinking  Eat a healthy diet that includes fresh fruits and vegetables, whole grains, good sources of protein such as meat, eggs, or tofu, and low-fat dairy products.  Avoid raw meat and unpasteurized juice, milk, and cheese. These carry germs that can harm you and your baby.  Eat 4 or 5 small meals rather than 3 large meals a day.  You may need to take these actions to prevent or treat constipation: ? Drink enough fluid to keep your urine pale yellow. ? Eat foods that are high in fiber, such as beans, whole grains, and fresh fruits and vegetables. ? Limit foods that are high in fat and   processed sugars, such as fried or sweet foods. Activity  Exercise only as directed by your health care provider. Most people can continue their usual exercise routine during pregnancy. Try to exercise for 30 minutes at least 5 days a week. Stop exercising if you experience contractions in the uterus.  Stop exercising if you develop pain or cramping in the lower abdomen or lower back.  Avoid heavy lifting.  Do not exercise if it is very hot or  humid or if you are at a high altitude.  If you choose to, you may continue to have sex unless your health care provider tells you not to. Relieving pain and discomfort  Take frequent breaks and rest with your legs raised (elevated) if you have leg cramps or low back pain.  Take warm sitz baths to soothe any pain or discomfort caused by hemorrhoids. Use hemorrhoid cream if your health care provider approves.  Wear a supportive bra to prevent discomfort from breast tenderness.  If you develop varicose veins: ? Wear support hose as told by your health care provider. ? Elevate your feet for 15 minutes, 3-4 times a day. ? Limit salt in your diet. Safety  Talk to your health care provider before traveling far distances.  Do not use hot tubs, steam rooms, or saunas.  Wear your seat belt at all times when driving or riding in a car.  Talk with your health care provider if someone is verbally or physically abusive to you. Preparing for birth To prepare for the arrival of your baby:  Take prenatal classes to understand, practice, and ask questions about labor and delivery.  Visit the hospital and tour the maternity area.  Purchase a rear-facing car seat and make sure you know how to install it in your car.  Prepare the baby's room or sleeping area. Make sure to remove all pillows and stuffed animals from the baby's crib to prevent suffocation. General instructions  Avoid cat litter boxes and soil used by cats. These carry germs that can cause birth defects in the baby. If you have a cat, ask someone to clean the litter box for you.  Do not douche or use tampons. Do not use scented sanitary pads.  Do not use any products that contain nicotine or tobacco, such as cigarettes, e-cigarettes, and chewing tobacco. If you need help quitting, ask your health care provider.  Do not use any herbal remedies, illegal drugs, or medicines that were not prescribed to you. Chemicals in these products  can harm your baby.  Do not drink alcohol.  You will have more frequent prenatal exams during the third trimester. During a routine prenatal visit, your health care provider will do a physical exam, perform tests, and discuss your overall health. Keep all follow-up visits. This is important. Where to find more information  American Pregnancy Association: americanpregnancy.org  American College of Obstetricians and Gynecologists: acog.org/en/Womens%20Health/Pregnancy  Office on Women's Health: womenshealth.gov/pregnancy Contact a health care provider if you have:  A fever.  Mild pelvic cramps, pelvic pressure, or nagging pain in your abdominal area or lower back.  Vomiting or diarrhea.  Bad-smelling vaginal discharge or foul-smelling urine.  Pain when you urinate.  A headache that does not go away when you take medicine.  Visual changes or see spots in front of your eyes. Get help right away if:  Your water breaks.  You have regular contractions less than 5 minutes apart.  You have spotting or bleeding from your vagina.  You   have severe abdominal pain.  You have difficulty breathing.  You have chest pain.  You have fainting spells.  You have not felt your baby move for the time period told by your health care provider.  You have new or increased pain, swelling, or redness in an arm or leg. Summary  The third trimester of pregnancy is from week 28 through week 40 (months 7 through 9).  You may have more problems sleeping. This can be caused by the size of your abdomen, an increased need to urinate, and an increase in your body's metabolism.  You will have more frequent prenatal exams during the third trimester. Keep all follow-up visits. This is important. This information is not intended to replace advice given to you by your health care provider. Make sure you discuss any questions you have with your health care provider. Document Revised: 03/20/2020 Document  Reviewed: 01/25/2020 Elsevier Patient Education  2021 Elsevier Inc.   Fetal Movement Counts Patient Name: ________________________________________________ Patient Due Date: ____________________  What is a fetal movement count? A fetal movement count is the number of times that you feel your baby move during a certain amount of time. This may also be called a fetal kick count. A fetal movement count is recommended for every pregnant woman. You may be asked to start counting fetal movements as early as week 28 of your pregnancy. Pay attention to when your baby is most active. You may notice your baby's sleep and wake cycles. You may also notice things that make your baby move more. You should do a fetal movement count:  When your baby is normally most active.  At the same time each day. A good time to count movements is while you are resting, after having something to eat and drink. How do I count fetal movements? 1. Find a quiet, comfortable area. Sit, or lie down on your side. 2. Write down the date, the start time and stop time, and the number of movements that you felt between those two times. Take this information with you to your health care visits. 3. Write down your start time when you feel the first movement. 4. Count kicks, flutters, swishes, rolls, and jabs. You should feel at least 10 movements. 5. You may stop counting after you have felt 10 movements, or if you have been counting for 2 hours. Write down the stop time. 6. If you do not feel 10 movements in 2 hours, contact your health care provider for further instructions. Your health care provider may want to do additional tests to assess your baby's well-being. Contact a health care provider if:  You feel fewer than 10 movements in 2 hours.  Your baby is not moving like he or she usually does. Date: ____________ Start time: ____________ Stop time: ____________ Movements: ____________ Date: ____________ Start time:  ____________ Stop time: ____________ Movements: ____________ Date: ____________ Start time: ____________ Stop time: ____________ Movements: ____________ Date: ____________ Start time: ____________ Stop time: ____________ Movements: ____________ Date: ____________ Start time: ____________ Stop time: ____________ Movements: ____________ Date: ____________ Start time: ____________ Stop time: ____________ Movements: ____________ Date: ____________ Start time: ____________ Stop time: ____________ Movements: ____________ Date: ____________ Start time: ____________ Stop time: ____________ Movements: ____________ Date: ____________ Start time: ____________ Stop time: ____________ Movements: ____________ This information is not intended to replace advice given to you by your health care provider. Make sure you discuss any questions you have with your health care provider. Document Revised: 06/01/2019 Document Reviewed: 06/01/2019 Elsevier Patient  Education  2021 Elsevier Inc.  

## 2021-01-01 NOTE — Progress Notes (Signed)
   PRENATAL VISIT NOTE  Subjective:  [name] is a 35 y.o. G2P1001 at [redacted]w[redacted]d being seen today for ongoing prenatal care.  She is currently monitored for the following issues for this low-risk pregnancy and has Obesity BMI=38.9; Encounter for supervision of normal pregnancy, antepartum; Alpha thalassemia silent carrier; and [redacted] weeks gestation of pregnancy on their problem list.  Patient reports concern that her partner had penile itching and discomfort s/p sexual activity. Pt interested in testing today. Pt denies any abnormal vaginal discharge/itching/pain.  Contractions: Not present. Vag. Bleeding: None.  Movement: Present. Denies leaking of fluid.   The following portions of the patient's history were reviewed and updated as appropriate: allergies, current medications, past family history, past medical history, past social history, past surgical history and problem list.   Objective:   Vitals:   01/01/21 0850  BP: 127/77  Pulse: 99  Weight: 272 lb 6.4 oz (123.6 kg)    Fetal Status: Fetal Heart Rate (bpm): 150 Fundal Height: 34 cm Movement: Present     General:  Alert, oriented and cooperative. Patient is in no acute distress.  Skin: Skin is warm and dry. No rash noted.   Cardiovascular: Normal heart rate noted  Respiratory: Normal respiratory effort, no problems with respiration noted  Abdomen: Soft, gravid, appropriate for gestational age.  Pain/Pressure: Present     Pelvic: Cervical exam deferred        Extremities: Normal range of motion.  Edema: None  Mental Status: Normal mood and affect. Normal behavior. Normal judgment and thought content.   Assessment and Plan:  Pregnancy: G2P1001 at [redacted]w[redacted]d 1. [redacted] weeks gestation of pregnancy, Supervision of other normal pregnancy, antepartum: No red flag signs/symptoms today. +FHTs & +fetal movement. Normal BP. Appropriate fundal height. Desires waterbirth and has completed class. Plan for breastfeeding. - declines birth control s/p  counseling today - declines Tdap, flu vaccines s/p counseling today - plan for f/u ultrasound on 3/25 given BMI - plan for f/u appt in 2 weeks with midwife to sign waterbirth consent form  2. Vaginal discharge during pregnancy in third trimester - Cervicovaginal ancillary only( [place])  3. Alpha thalassemia silent carrier: Pt aware. No reported concerns at this time.  4. Obesity, unspecified classification, unspecified obesity type, unspecified whether serious comorbidity present - encouraged continued healthy diet and regular physical activity - f/u ultrasound on 3/25 per MFM  Preterm labor symptoms and general obstetric precautions including but not limited to vaginal bleeding, contractions, leaking of fluid and fetal movement were reviewed in detail with the patient. Please refer to After Visit Summary for other counseling recommendations.   Return in about 2 weeks (around 01/15/2021) for f/u prenatal appt in person, needs midwife given waterbirth consent needs to be signed.  Future Appointments  Date Time Provider Department Center  01/15/2021  8:15 AM Hurshel Party, CNM [facility] None  01/17/2021  7:15 AM [facility] NURSE [facility]  01/17/2021  7:30 AM [facility] US3 [facility]   [name], [name], MD OB Fellow, [facility] 01/01/2021 1:33 PM

## 2021-01-03 LAB — CERVICOVAGINAL ANCILLARY ONLY
Bacterial Vaginitis (gardnerella): NEGATIVE
Candida Glabrata: NEGATIVE
Candida Vaginitis: POSITIVE — AB
Chlamydia: NEGATIVE
Comment: NEGATIVE
Comment: NEGATIVE
Comment: NEGATIVE
Comment: NEGATIVE
Comment: NEGATIVE
Comment: NORMAL
Neisseria Gonorrhea: NEGATIVE
Trichomonas: NEGATIVE

## 2021-01-04 ENCOUNTER — Telehealth: Payer: Self-pay | Admitting: Obstetrics and Gynecology

## 2021-01-04 DIAGNOSIS — B3731 Acute candidiasis of vulva and vagina: Secondary | ICD-10-CM

## 2021-01-04 DIAGNOSIS — B373 Candidiasis of vulva and vagina: Secondary | ICD-10-CM

## 2021-01-04 MED ORDER — MICONAZOLE 3 200 MG VA SUPP: 200.0000 mg | Freq: Every day | VAGINAL | 0 refills | Status: DC

## 2021-01-04 NOTE — Telephone Encounter (Signed)
My chart message sent given finding of vaginal yeast on cervicovaginal swab. Script for miconazole sent to pt's preferred pharmacy. Pt encouraged to call clinic if any additional questions/concerns.  Sheila Oats, MD OB Fellow, [facility] 01/04/2021 10:42 PM

## 2021-01-15 ENCOUNTER — Encounter: Payer: Self-pay | Admitting: Certified Nurse Midwife

## 2021-01-15 ENCOUNTER — Other Ambulatory Visit: Payer: Self-pay

## 2021-01-15 ENCOUNTER — Ambulatory Visit (INDEPENDENT_AMBULATORY_CARE_PROVIDER_SITE_OTHER): Payer: BC Managed Care – PPO | Admitting: Certified Nurse Midwife

## 2021-01-15 VITALS — BP 120/72 | HR 107 | Wt 275.0 lb

## 2021-01-15 DIAGNOSIS — Z3A32 32 weeks gestation of pregnancy: Secondary | ICD-10-CM

## 2021-01-15 DIAGNOSIS — O99210 Obesity complicating pregnancy, unspecified trimester: Secondary | ICD-10-CM | POA: Insufficient documentation

## 2021-01-15 DIAGNOSIS — Z3480 Encounter for supervision of other normal pregnancy, unspecified trimester: Secondary | ICD-10-CM

## 2021-01-15 NOTE — Patient Instructions (Signed)

## 2021-01-15 NOTE — Progress Notes (Signed)
   PRENATAL VISIT NOTE  Subjective:  [name] is a 35 y.o. G2P1001 at [redacted]w[redacted]d being seen today for ongoing prenatal care.  She is currently monitored for the following issues for this low-risk pregnancy and has Obesity BMI=38.9; Encounter for supervision of normal pregnancy, antepartum; Alpha thalassemia silent carrier; [redacted] weeks gestation of pregnancy; and Obesity during pregnancy, antepartum on their problem list.  Patient reports no complaints.  Contractions: Not present. Vag. Bleeding: None.  Movement: Present. Denies leaking of fluid.   The following portions of the patient's history were reviewed and updated as appropriate: allergies, current medications, past family history, past medical history, past social history, past surgical history and problem list.   Objective:   Vitals:   01/15/21 0817  BP: 120/72  Pulse: (!) 107  Weight: 275 lb (124.7 kg)    Fetal Status: Fetal Heart Rate (bpm): 154   Movement: Present     General:  Alert, oriented and cooperative. Patient is in no acute distress.  Skin: Skin is warm and dry. No rash noted.   Cardiovascular: Normal heart rate noted  Respiratory: Normal respiratory effort, no problems with respiration noted  Abdomen: Soft, gravid, appropriate for gestational age.  Pain/Pressure: Absent     Pelvic: Cervical exam deferred        Extremities: Normal range of motion.  Edema: None  Mental Status: Normal mood and affect. Normal behavior. Normal judgment and thought content.   Assessment and Plan:  Pregnancy: G2P1001 at [redacted]w[redacted]d 1. Supervision of other normal pregnancy, antepartum - Patient doing well, no complaints or concerns at this time  - Routine prenatal care - Anticipatory guidance on upcoming appointments with GBS screening upcoming, discussed with patient recommendation for antibiotics during labor if positive, patient verbalizes understanding  - Patient wants water birth, needs consent signed at next visit   2. Obesity  during pregnancy, antepartum - Follow up ultrasound for fetal growth scheduled  - continue bASA   3. [redacted] weeks gestation of pregnancy  Preterm labor symptoms and general obstetric precautions including but not limited to vaginal bleeding, contractions, leaking of fluid and fetal movement were reviewed in detail with the patient. Please refer to After Visit Summary for other counseling recommendations.   Return in about 16 days (around 01/31/2021) for LROB, in person with CNM, sign WB consent .  Future Appointments  Date Time Provider Department Center  01/17/2021  7:15 AM [facility] NURSE [facility]  01/17/2021  7:30 AM [facility] US3 [facility]  02/03/2021  8:55 AM [place]Kir[place], Wilmer Floor, CNM [facility] None    Sharyon Cable, CNM

## 2021-01-17 ENCOUNTER — Ambulatory Visit: Payer: BC Managed Care – PPO | Admitting: *Deleted

## 2021-01-17 ENCOUNTER — Encounter: Payer: Self-pay | Admitting: *Deleted

## 2021-01-17 ENCOUNTER — Ambulatory Visit: Payer: BC Managed Care – PPO | Attending: Obstetrics and Gynecology

## 2021-01-17 ENCOUNTER — Other Ambulatory Visit: Payer: Self-pay

## 2021-01-17 ENCOUNTER — Other Ambulatory Visit: Payer: Self-pay | Admitting: *Deleted

## 2021-01-17 DIAGNOSIS — O99210 Obesity complicating pregnancy, unspecified trimester: Secondary | ICD-10-CM | POA: Diagnosis not present

## 2021-01-17 DIAGNOSIS — R638 Other symptoms and signs concerning food and fluid intake: Secondary | ICD-10-CM

## 2021-01-17 DIAGNOSIS — O99213 Obesity complicating pregnancy, third trimester: Secondary | ICD-10-CM

## 2021-01-17 DIAGNOSIS — E669 Obesity, unspecified: Secondary | ICD-10-CM

## 2021-01-17 DIAGNOSIS — Z148 Genetic carrier of other disease: Secondary | ICD-10-CM

## 2021-01-17 DIAGNOSIS — Z3A32 32 weeks gestation of pregnancy: Secondary | ICD-10-CM | POA: Diagnosis not present

## 2021-01-17 DIAGNOSIS — Z8616 Personal history of COVID-19: Secondary | ICD-10-CM

## 2021-01-29 ENCOUNTER — Inpatient Hospital Stay (HOSPITAL_COMMUNITY)
Admission: AD | Admit: 2021-01-29 | Discharge: 2021-01-29 | Disposition: A | Discharge status: Home / Self Care | Payer: BC Managed Care – PPO | Attending: Obstetrics & Gynecology | Admitting: Obstetrics & Gynecology

## 2021-01-29 ENCOUNTER — Encounter: Payer: Self-pay | Admitting: Obstetrics

## 2021-01-29 ENCOUNTER — Ambulatory Visit (INDEPENDENT_AMBULATORY_CARE_PROVIDER_SITE_OTHER): Payer: BC Managed Care – PPO | Admitting: Obstetrics

## 2021-01-29 ENCOUNTER — Encounter (HOSPITAL_COMMUNITY): Payer: Self-pay | Admitting: Obstetrics & Gynecology

## 2021-01-29 ENCOUNTER — Other Ambulatory Visit: Payer: Self-pay

## 2021-01-29 VITALS — BP 125/84 | HR 104 | Wt 272.0 lb

## 2021-01-29 DIAGNOSIS — Z3480 Encounter for supervision of other normal pregnancy, unspecified trimester: Secondary | ICD-10-CM

## 2021-01-29 DIAGNOSIS — O26893 Other specified pregnancy related conditions, third trimester: Secondary | ICD-10-CM

## 2021-01-29 DIAGNOSIS — O99891 Other specified diseases and conditions complicating pregnancy: Secondary | ICD-10-CM

## 2021-01-29 DIAGNOSIS — O99353 Diseases of the nervous system complicating pregnancy, third trimester: Secondary | ICD-10-CM | POA: Diagnosis not present

## 2021-01-29 DIAGNOSIS — R030 Elevated blood-pressure reading, without diagnosis of hypertension: Secondary | ICD-10-CM | POA: Diagnosis not present

## 2021-01-29 DIAGNOSIS — Z3A34 34 weeks gestation of pregnancy: Secondary | ICD-10-CM | POA: Diagnosis not present

## 2021-01-29 DIAGNOSIS — R519 Headache, unspecified: Secondary | ICD-10-CM

## 2021-01-29 DIAGNOSIS — G43009 Migraine without aura, not intractable, without status migrainosus: Secondary | ICD-10-CM | POA: Insufficient documentation

## 2021-01-29 DIAGNOSIS — Z7982 Long term (current) use of aspirin: Secondary | ICD-10-CM | POA: Diagnosis not present

## 2021-01-29 DIAGNOSIS — O99210 Obesity complicating pregnancy, unspecified trimester: Secondary | ICD-10-CM

## 2021-01-29 LAB — COMPREHENSIVE METABOLIC PANEL
ALT: 23 U/L (ref 0–44)
AST: 19 U/L (ref 15–41)
Albumin: 2.9 g/dL — ABNORMAL LOW (ref 3.5–5.0)
Alkaline Phosphatase: 91 U/L (ref 38–126)
Anion gap: 6 (ref 5–15)
BUN: 6 mg/dL (ref 6–20)
CO2: 20 mmol/L — ABNORMAL LOW (ref 22–32)
Calcium: 8.6 mg/dL — ABNORMAL LOW (ref 8.9–10.3)
Chloride: 106 mmol/L (ref 98–111)
Creatinine, Ser: 0.47 mg/dL (ref 0.44–1.00)
GFR, Estimated: >60 mL/min (ref >60)
Glucose, Bld: 80 mg/dL (ref 70–99)
Potassium: 4.1 mmol/L (ref 3.5–5.1)
Sodium: 132 mmol/L — ABNORMAL LOW (ref 135–145)
Total Bilirubin: 0.3 mg/dL (ref 0.3–1.2)
Total Protein: 6.2 g/dL — ABNORMAL LOW (ref 6.5–8.1)

## 2021-01-29 LAB — CBC
HCT: 36.7 % (ref 36.0–46.0)
Hemoglobin: 12.5 g/dL (ref 12.0–15.0)
MCH: 27.7 pg (ref 26.0–34.0)
MCHC: 34.1 g/dL (ref 30.0–36.0)
MCV: 81.4 fL (ref 80.0–100.0)
Platelets: 239 10*3/uL (ref 150–400)
RBC: 4.51 MIL/uL (ref 3.87–5.11)
RDW: 13.5 % (ref 11.5–15.5)
WBC: 9.4 10*3/uL (ref 4.0–10.5)
nRBC: 0 % (ref 0.0–0.2)

## 2021-01-29 LAB — PROTEIN / CREATININE RATIO, URINE
Creatinine, Urine: 130.52 mg/dL
Protein Creatinine Ratio: 0.19 mg/mg{Cre} — ABNORMAL HIGH (ref 0.00–0.15)
Total Protein, Urine: 25 mg/dL

## 2021-01-29 MED ORDER — METOCLOPRAMIDE HCL 10 MG PO TABS: 10.0000 mg | ORAL_TABLET | Freq: Three times a day (TID) | ORAL | 0 refills | Status: DC | PRN

## 2021-01-29 MED ORDER — CYCLOBENZAPRINE HCL 5 MG PO TABS: 5.0000 mg | ORAL_TABLET | Freq: Three times a day (TID) | ORAL | 0 refills | Status: DC | PRN

## 2021-01-29 MED ORDER — ACETAMINOPHEN 500 MG PO TABS
1000.0000 mg | ORAL_TABLET | Freq: Once | ORAL | Status: AC
  Administered 2021-01-29: 1000 mg via ORAL
  Filled 2021-01-29: qty 2

## 2021-01-29 MED ORDER — CYCLOBENZAPRINE HCL 5 MG PO TABS
10.0000 mg | ORAL_TABLET | Freq: Once | ORAL | Status: AC
  Administered 2021-01-29: 10 mg via ORAL
  Filled 2021-01-29: qty 2

## 2021-01-29 NOTE — MAU Provider Note (Signed)
History     [id number]  Arrival date and time: 01/29/21 1434    Chief Complaint  Patient presents with  . BP Eval     HPI [name] is a 35 y.o. at 49w1dby LMP with PMHx notable for migraines, who presents for headache. Was seen in the office today & sent to MAU for further evaluation.  Reports headache that started yesterday. Has history of migraines & states this is similar to previous headaches. Prior to pregnancy she treated headaches with nurtec. Has not been treating headache during the pregnancy. Took 2 tylenol yesterday without relief. Reports frontal throbbing headache. Endorses photophobia. Denies n/v, fever, abdominal pain, visual disturbance, epigastric pain, or history of hypertension. Good fetal movement.    A/Positive/-- (10/14 1324)  OB History    Gravida  2   Para  1   Term  1   Preterm  0   AB  0   Living  1     SAB  0   IAB  0   Ectopic  0   Multiple  0   Live Births  1           Past Medical History:  Diagnosis Date  . Migraine     Past Surgical History:  Procedure Laterality Date  . WISDOM TOOTH EXTRACTION      Family History  Problem Relation Age of Onset  . Breast cancer Mother   . Heart disease Father   . Breast cancer Maternal Grandmother     Social History   Socioeconomic History  . Marital status: Single    Spouse name: Not on file  . Number of children: Not on file  . Years of education: 178 . Highest education level: Not on file  Occupational History  . Not on file  Tobacco Use  . Smoking status: Never Smoker  . Smokeless tobacco: Never Used  Vaping Use  . Vaping Use: Never used  Substance and Sexual Activity  . Alcohol use: Not Currently    Comment: Last drink 05/2020  . Drug use: No  . Sexual activity: Yes    Partners: Male    Birth control/protection: Other-see comments, None    Comment: pregnant  Other Topics Concern  . Not on file  Social History Narrative  . Not on file   Social  Determinants of Health   Financial Resource Strain: Not on file  Food Insecurity: Not on file  Transportation Needs: Not on file  Physical Activity: Not on file  Stress: Not on file  Social Connections: Not on file  Intimate Partner Violence: Not At Risk  . Fear of Current or Ex-Partner: No  . Emotionally Abused: No  . Physically Abused: No  . Sexually Abused: No    No Known Allergies  No current facility-administered medications on file prior to encounter.   Current Outpatient Medications on File Prior to Encounter  Medication Sig Dispense Refill  . aspirin EC 81 MG tablet Take 1 tablet (81 mg total) by mouth daily. 60 tablet 2  . Probiotic Product (PROBIOTIC-10) CHEW Chew by mouth.    . Blood Pressure Monitoring (BLOOD PRESSURE KIT) KIT 1 kit by Does not apply route once a week. 1 kit 0  . Prenatal MV-Min-FA-Omega-3 (PRENATAL GUMMIES/DHA & FA) 0.4-32.5 MG CHEW Chew by mouth. (Patient not taking: No sig reported)       ROS Pertinent positives and negative per HPI, all others reviewed and negative  Physical Exam  BP 122/63   Pulse 95   Temp 98.1 F (36.7 C) (Oral)   Resp 18   Ht 5' 5" (1.651 m)   Wt 124.1 kg   LMP 05/31/2020   SpO2 98%   BMI 45.51 kg/m   Physical Exam Vitals and nursing note reviewed.  Constitutional:      General: She is not in acute distress.    Appearance: Normal appearance.  HENT:     Head: Normocephalic and atraumatic.  Cardiovascular:     Rate and Rhythm: Normal rate and regular rhythm.     Heart sounds: Normal heart sounds.  Pulmonary:     Effort: Pulmonary effort is normal. No respiratory distress.     Breath sounds: Normal breath sounds. No wheezing.  Skin:    General: Skin is warm and dry.  Neurological:     Mental Status: She is alert.  Psychiatric:        Mood and Affect: Mood normal.        Behavior: Behavior normal.    FHT Baseline 140, moderate variability, 15x15 accels, no decels Toco: irregular Cat:  1  Labs Results for orders placed or performed during the hospital encounter of 01/29/21 (from the past 24 hour(s))  Protein / creatinine ratio, urine     Status: Abnormal   Collection Time: 01/29/21  4:09 PM  Result Value Ref Range   Creatinine, Urine 130.52 mg/dL   Total Protein, Urine 25 mg/dL   Protein Creatinine Ratio 0.19 (H) 0.00 - 0.15 mg/mg[Cre]  CBC     Status: None   Collection Time: 01/29/21  4:13 PM  Result Value Ref Range   WBC 9.4 4.0 - 10.5 K/uL   RBC 4.51 3.87 - 5.11 MIL/uL   Hemoglobin 12.5 12.0 - 15.0 g/dL   HCT 36.7 36.0 - 46.0 %   MCV 81.4 80.0 - 100.0 fL   MCH 27.7 26.0 - 34.0 pg   MCHC 34.1 30.0 - 36.0 g/dL   RDW 13.5 11.5 - 15.5 %   Platelets 239 150 - 400 K/uL   nRBC 0.0 0.0 - 0.2 %  Comprehensive metabolic panel     Status: Abnormal   Collection Time: 01/29/21  4:13 PM  Result Value Ref Range   Sodium 132 (L) 135 - 145 mmol/L   Potassium 4.1 3.5 - 5.1 mmol/L   Chloride 106 98 - 111 mmol/L   CO2 20 (L) 22 - 32 mmol/L   Glucose, Bld 80 70 - 99 mg/dL   BUN 6 6 - 20 mg/dL   Creatinine, Ser 0.47 0.44 - 1.00 mg/dL   Calcium 8.6 (L) 8.9 - 10.3 mg/dL   Total Protein 6.2 (L) 6.5 - 8.1 g/dL   Albumin 2.9 (L) 3.5 - 5.0 g/dL   AST 19 15 - 41 U/L   ALT 23 0 - 44 U/L   Alkaline Phosphatase 91 38 - 126 U/L   Total Bilirubin 0.3 0.3 - 1.2 mg/dL   GFR, Estimated >60 >60 mL/min   Anion gap 6 5 - 15    Imaging No results found.  MAU Course  Procedures  Lab Orders     CBC     Comprehensive metabolic panel     Protein / creatinine ratio, urine Meds ordered this encounter  Medications  . cyclobenzaprine (FLEXERIL) tablet 10 mg  . acetaminophen (TYLENOL) tablet 1,000 mg  . metoCLOPramide (REGLAN) 10 MG tablet    Sig: Take 1 tablet (10 mg total) by mouth every 8 (eight)  hours as needed (take with tylenol for headache).    Dispense:  30 tablet    Refill:  0    Order Specific Question:   Supervising Provider    Answer:   Janyth Pupa [[id number]]  .  cyclobenzaprine (FLEXERIL) 5 MG tablet    Sig: Take 1 tablet (5 mg total) by mouth 3 (three) times daily as needed (headache).    Dispense:  20 tablet    Refill:  0    Order Specific Question:   Supervising Provider    Answer:   Janyth Pupa [[id number]]   Imaging Orders  No imaging studies ordered today    MDM Patient presents with headache consistent with her history of migraine headaches. Treated in MAU with tylenol & flexeril. Patient report complete resolution of her headache. Will rx reglan & flexeril to take with tylenol for future headaches.   Intake BP in MAU was elevated. All repeats WNL. Preeclampsia labs negative & headache resolved with medication.   Assessment and Plan   1. Migraine without aura and without status migrainosus, not intractable   2. Elevated BP without diagnosis of hypertension   3. [redacted] weeks gestation of pregnancy    -Rx reglan & flexeril -reviewed treatment of future headaches -reviewed preeclampsia precautions & reasons to return to MAU  Jorje Guild, NP

## 2021-01-29 NOTE — Discharge Instructions (Signed)
For prevention of migraines in pregnancy: -Magnesium, 400mg  by mouth, once daily -Vitamin B2, 400mg  by mouth, once daily  For treatment of migraines in pregnancy: -take medication at the first sign of the pain of a headache, or the first sign of your aura -start with 1000mg  Tylenol (do not exceed 4000mg  of Tylenol in 24hrs), with or without Reglan 10mg  -if no relief after 1-2hours, can take Flexeril 10mg  -if the above regimen does not resolve your headache at all, please come to MAU for additional treatment     Hypertension During Pregnancy High blood pressure (hypertension) is when the force of blood pumping through the arteries is high enough to cause problems with your health. Arteries are blood vessels that carry blood from the heart throughout the body. Hypertension during pregnancy can cause problems for you and your baby. It can be mild or severe. There are different types of hypertension that can happen during pregnancy. These include:  Chronic hypertension. This happens when you had high blood pressure before you became pregnant, and it continues during the pregnancy. Hypertension that develops before you are [redacted] weeks pregnant and continues during the pregnancy is also called chronic hypertension. If you have chronic hypertension, it will not go away after you have your baby. You will need follow-up visits with your health care provider after you have your baby. Your health care provider may want you to keep taking medicine for your blood pressure.  Gestational hypertension. This is hypertension that develops after the 20th week of pregnancy. Gestational hypertension usually goes away after you have your baby, but your health care provider will need to monitor your blood pressure to make sure that it is getting better.  Postpartum hypertension. This is high blood pressure that was present before delivery and continues after delivery or that starts after delivery. This usually occurs  within 48 hours after childbirth but may occur up to 6 weeks after giving birth. When hypertension during pregnancy is severe, it is a medical emergency that requires treatment right away. How does this affect me? Women who have hypertension during pregnancy have a greater chance of developing hypertension later in life or during future pregnancies. In some cases, hypertension during pregnancy can cause serious complications, such as:  Stroke.  Heart attack.  Injury to other organs, such as kidneys, lungs, or liver.  Preeclampsia.  A condition called hemolysis, elevated liver enzymes, and low platelet count (HELLP) syndrome.  Convulsions or seizures.  Placental abruption. How does this affect my baby? Hypertension during pregnancy can affect your baby. Your baby may:  Be born early (prematurely).  Not weigh as much as he or she should at birth (low birth weight).  Not tolerate labor well, leading to an unplanned cesarean delivery. This condition may also result in a baby's death before birth (stillbirth). What are the risks? There are certain factors that make it more likely for you to develop hypertension during pregnancy. These include:  Having hypertension during a previous pregnancy or a family history of hypertension.  Being overweight.  Being age 56 or older.  Being pregnant for the first time.  Being pregnant with more than one baby.  Becoming pregnant using fertilization methods, such as IVF (in vitro fertilization).  Having other medical problems, such as diabetes, kidney disease, or lupus. What can I do to lower my risk? The exact cause of hypertension during pregnancy is not known. You may be able to lower your risk by:  Maintaining a healthy weight.  Eating  a healthy and balanced diet.  Following your health care provider's instructions about treating any long-term conditions that you had before becoming pregnant. It is very important to keep all of your  prenatal care appointments. Your health care provider will check your blood pressure and make sure that your pregnancy is progressing as expected. If a problem is found, early treatment can prevent complications.   How is this treated? Treatment for hypertension during pregnancy varies depending on the type of hypertension you have and how serious it is.  If you were taking medicine for high blood pressure before you became pregnant, talk with your health care provider. You may need to change medicine during pregnancy because some medicines, like ACE inhibitors, may not be considered safe for your baby.  If you have gestational hypertension, your health care provider may order medicine to treat this during pregnancy.  If you are at risk for preeclampsia, your health care provider may recommend that you take a low-dose aspirin during your pregnancy.  If you have severe hypertension, you may need to be hospitalized so you and your baby can be monitored closely. You may also need to be given medicine to lower your blood pressure.  In some cases, if your condition gets worse, you may need to deliver your baby early. Follow these instructions at home: Eating and drinking  Drink enough fluid to keep your urine pale yellow.  Avoid caffeine.   Lifestyle  Do not use any products that contain nicotine or tobacco. These products include cigarettes, chewing tobacco, and vaping devices, such as e-cigarettes. If you need help quitting, ask your health care provider.  Do not use alcohol or drugs.  Avoid stress as much as possible.  Rest and get plenty of sleep.  Regular exercise can help to reduce your blood pressure. Ask your health care provider what kinds of exercise are best for you. General instructions  Take over-the-counter and prescription medicines only as told by your health care provider.  Keep all prenatal and follow-up visits. This is important. Contact a health care provider  if:  You have symptoms that your health care provider told you may require more treatment or monitoring, such as: ? Headaches. ? Nausea or vomiting. ? Abdominal pain. ? Dizziness. ? Light-headedness. Get help right away if:  You have symptoms of serious complications, such as: ? Severe abdominal pain that does not get better with treatment. ? A severe headache that does not get better, blurred vision, or double vision. ? Vomiting that does not get better. ? Sudden, rapid weight gain or swelling in your hands, ankles, or face. ? Vaginal bleeding. ? Blood in your urine. ? Shortness of breath or chest pain. ? Weakness on one side of your body or difficulty speaking.  Your baby is not moving as much as usual. These symptoms may represent a serious problem that is an emergency. Do not wait to see if the symptoms will go away. Get medical help right away. Call your local emergency services (911 in the [place] Do not drive yourself to the hospital. Summary  Hypertension during pregnancy can cause problems for you and your baby.  Treatment for hypertension during pregnancy varies depending on the type of hypertension you have and how serious it is.  Keep all prenatal and follow-up visits. This is important.  Get help right away if you have symptoms of serious complications related to high blood pressure. This information is not intended to replace advice given to you by  your health care provider. Make sure you discuss any questions you have with your health care provider. Document Revised: 07/04/2020 Document Reviewed: 07/04/2020 Elsevier Patient Education  2021 [employer].

## 2021-01-29 NOTE — Progress Notes (Signed)
Subjective:  [name] is a 35 y.o. G2P1001 at [redacted]w[redacted]d being seen today for ongoing prenatal care.  She is currently monitored for the following issues for this low-risk pregnancy and has Obesity BMI=38.9; Encounter for supervision of normal pregnancy, antepartum; Alpha thalassemia silent carrier; [redacted] weeks gestation of pregnancy; and Obesity during pregnancy, antepartum on their problem list.  Patient reports headache.  Contractions: Not present. Vag. Bleeding: None.  Movement: Present. Denies leaking of fluid.   The following portions of the patient's history were reviewed and updated as appropriate: allergies, current medications, past family history, past medical history, past social history, past surgical history and problem list. Problem list updated.  Objective:   Vitals:   01/29/21 1351  BP: 125/84  Pulse: (!) 104  Weight: 272 lb (123.4 kg)    Fetal Status:     Movement: Present     General:  Alert, oriented and cooperative. Patient is in no acute distress.  Skin: Skin is warm and dry. No rash noted.   Cardiovascular: Normal heart rate noted  Respiratory: Normal respiratory effort, no problems with respiration noted  Abdomen: Soft, gravid, appropriate for gestational age. Pain/Pressure: Present     Pelvic:  Cervical exam deferred        Extremities: Normal range of motion.  Edema: Trace  Mental Status: Normal mood and affect. Normal behavior. Normal judgment and thought content.   Urinalysis:      Assessment and Plan:  Pregnancy: G2P1001 at [redacted]w[redacted]d  1. Supervision of other normal pregnancy, antepartum  2. Obesity during pregnancy, antepartum  3. Headache in pregnancy, antepartum, third trimester - severe headache.  Has not slept for 2 days. - denies visual changes - has a history of migraines, and this headache is similar to a migraine   Preterm labor symptoms and general obstetric precautions including but not limited to vaginal bleeding, contractions, leaking of  fluid and fetal movement were reviewed in detail with the patient. Please refer to After Visit Summary for other counseling recommendations .  Return in about 2 weeks (around 02/12/2021) for ROB.   Brock Bad, MD  01/29/21

## 2021-01-29 NOTE — Progress Notes (Signed)
ROB 34w.  CC: Pain in Left leg.  HA x 2 days. Pt has tried Tylenol extra strength still no relief.

## 2021-01-29 NOTE — MAU Note (Signed)
Sent from MD office for BP evaluation.  Reports has had migraine H/A for 2 days, took Tylenol x2 yesterday, no relief.  Denies visual disturbances and epigastric pain.  Denies LOF and VB.  Endorses +FM.

## 2021-02-03 ENCOUNTER — Encounter: Payer: BC Managed Care – PPO | Admitting: Advanced Practice Midwife

## 2021-02-04 ENCOUNTER — Telehealth (INDEPENDENT_AMBULATORY_CARE_PROVIDER_SITE_OTHER): Payer: BC Managed Care – PPO | Admitting: Nurse Practitioner

## 2021-02-04 ENCOUNTER — Encounter: Payer: Self-pay | Admitting: Nurse Practitioner

## 2021-02-04 DIAGNOSIS — O99210 Obesity complicating pregnancy, unspecified trimester: Secondary | ICD-10-CM

## 2021-02-04 DIAGNOSIS — O99353 Diseases of the nervous system complicating pregnancy, third trimester: Secondary | ICD-10-CM

## 2021-02-04 DIAGNOSIS — Z3490 Encounter for supervision of normal pregnancy, unspecified, unspecified trimester: Secondary | ICD-10-CM

## 2021-02-04 DIAGNOSIS — Z3A35 35 weeks gestation of pregnancy: Secondary | ICD-10-CM

## 2021-02-04 DIAGNOSIS — G5603 Carpal tunnel syndrome, bilateral upper limbs: Secondary | ICD-10-CM

## 2021-02-04 DIAGNOSIS — O99213 Obesity complicating pregnancy, third trimester: Secondary | ICD-10-CM

## 2021-02-04 DIAGNOSIS — E669 Obesity, unspecified: Secondary | ICD-10-CM

## 2021-02-04 MED ORDER — WRIST BRACE/LEFT MEDIUM MISC: 0 refills | Status: DC

## 2021-02-04 MED ORDER — WRIST BRACE/RIGHT MEDIUM MISC: 0 refills | Status: DC

## 2021-02-04 NOTE — Progress Notes (Signed)
Virtual ROB 35w  CC: Migraines, pain in left leg, numbness in hands.pt had recent MAU visit.   notes not being able to take muscle relaxer while at work wants advice on what to do while at work for discomfort.  not able to to check B/P at his time.

## 2021-02-04 NOTE — Progress Notes (Addendum)
OBSTETRICS PRENATAL VIRTUAL VISIT ENCOUNTER NOTE  [facility]   Patient location: Home  I connected with Dana PhilipsViviana R Handshoe on 02/04/21 at  3:00 PM EDT by MyChart Video Encounter and verified that I am speaking with the correct person using two identifiers. I discussed the limitations, risks, security and privacy concerns of performing an evaluation and management service virtually and the availability of in person appointments. I also discussed with the patient that there may be a patient responsible charge related to this service. The patient expressed understanding and agreed to proceed. Subjective:  [name] is a 35 y.o. G2P1001 at 5987w0d being seen today for ongoing prenatal care.  She is currently monitored for the following issues for this low-risk pregnancy and has Obesity BMI=38.9; Encounter for supervision of normal pregnancy, antepartum; Alpha thalassemia silent carrier; and Obesity during pregnancy, antepartum on their problem list.  Patient reports numbmess in left leg and pain in both hands upon awakening.  Contractions: Not present. Vag. Bleeding: None.  Movement: Present. Denies any leaking of fluid.   The following portions of the patient's history were reviewed and updated as appropriate: allergies, current medications, past family history, past medical history, past social history, past surgical history and problem list.   Objective:  There were no vitals filed for this visit.  Fetal Status:     Movement: Present     General:  Alert, oriented and cooperative. Patient is in no acute distress.  Respiratory: Normal respiratory effort, no problems with respiration noted  Mental Status: Normal mood and affect. Normal behavior. Normal judgment and thought content.  Rest of physical exam deferred due to type of encounter  Imaging: US MFM OB FOLLOW UP  Result Date:  01/17/2021 ----------------------------------------------------------------------  OBSTETRICS REPORT                       (Signed Final 01/17/2021 08:12 am) ---------------------------------------------------------------------- Patient Info  ID #:       [id number]                          D.O.B.:  [birth year] (34 yrs)  Name:       [name]               Visit Date: 01/17/2021 07:51 am ---------------------------------------------------------------------- Performed By  Attending:        Noralee Spaceavi Shankar MD        Ref. Address:     Faculty  Performed By:     Emeline DarlingKasie E Kiser BS,      [facility]                                                             [facility]  Referred By:      Catalina Antigua MD ---------------------------------------------------------------------- Orders  #  Description                           Code        Ordered By  1  [place] MFM OB FOLLOW UP                   74944.96    Noralee Space ----------------------------------------------------------------------  #  Order #                     Accession #                Episode #  1  [id number]                   [id number]                 [id number] ---------------------------------------------------------------------- Indications  Poor obstetrical history (Hx Covid)            O09.299  Genetic carrier (Alpha Thal Silent carrier,    Z14.8  Increased risk SMA)  Obesity complicating pregnancy, third          O99.213  trimester (Pregravid BMI 39)  [redacted] weeks gestation of pregnancy                Z3A.32  Low Risk NIPS ---------------------------------------------------------------------- Vital Signs                                                 Height:        5'5" ---------------------------------------------------------------------- Fetal Evaluation  Num Of Fetuses:          1  Fetal Heart Rate(bpm):  147  Cardiac Activity:       Observed  Presentation:           Cephalic  Placenta:               Anterior  P. Cord Insertion:      Previously Visualized  Amniotic Fluid  AFI FV:      Within normal limits  AFI Sum(cm)     %Tile       Largest Pocket(cm)  18.6            69          6.7  RUQ(cm)       RLQ(cm)       LUQ(cm)        LLQ(cm)  6.7           2.8           2.6            6.5 ---------------------------------------------------------------------- Biometry  BPD:      82.6  mm     G. Age:  33w 2d         66  %    CI:        74.91   %    70 - 86  FL/HC:      21.1   %    19.1 - 21.3  HC:      302.8  mm     G. Age:  33w 4d         44  %    HC/AC:      0.96        0.96 - 1.17  AC:      315.6  mm     G. Age:  35w 3d       > 99  %    FL/BPD:     77.2   %    71 - 87  FL:       63.8  mm     G. Age:  33w 0d         52  %    FL/AC:      20.2   %    20 - 24  Est. FW:    2418  gm      5 lb 5 oz     93  % ---------------------------------------------------------------------- OB History  Gravidity:    2         Term:   1        Prem:   0        SAB:   0  TOP:          0       Ectopic:  0        Living: 1 ---------------------------------------------------------------------- Gestational Age  LMP:           33w 0d        Date:  05/31/20                 EDD:   03/07/21  U/S Today:     33w 6d                                        EDD:   03/01/21  Best:          Armida Sans 3d     Det. By:  U/S C R L  (08/01/20)    EDD:   03/11/21 ---------------------------------------------------------------------- Anatomy  Cranium:               Appears normal         LVOT:                   Previously seen  Cavum:                 Previously seen        Aortic Arch:            Previously seen  Ventricles:            Appears normal         Ductal Arch:            Previously seen  Choroid Plexus:        Previously seen        Diaphragm:              Appears normal   Cerebellum:            Previously seen        Stomach:                Appears normal, left  sided  Posterior Fossa:       Previously seen        Abdomen:                Previously seen  Nuchal Fold:           Previously seen        Abdominal Wall:         Previously seen  Face:                  Orbits and profile     Cord Vessels:           Previously seen                         previously seen  Lips:                  Previously seen        Kidneys:                Appear normal  Palate:                Previously seen        Bladder:                Appears normal  Thoracic:              Previously seen        Spine:                  Previously seen  Heart:                 Appears normal         Upper Extremities:      Previously seen                         (4CH, axis, and                         situs)  RVOT:                  Appears normal         Lower Extremities:      Previously seen  Other:  Female gender previously seen. Nasal bone previously visualized. VC,          3VV and 3VTV previously visualized. ---------------------------------------------------------------------- Cervix Uterus Adnexa  Cervix  Not visualized (advanced GA >24wks) ---------------------------------------------------------------------- Impression  Maternal obesity.  Patient returned for fetal growth  assessment.  She does not have gestational diabetes.  Her pregnancy is dated by 8-week ultrasound and today the  patient confirmed that she had irregular menstrual cycles  before conception.  On today's ultrasound, the estimated fetal weight is at the  93rd percentile amniotic fluid is normal good fetal activity  seen.  Cephalic presentation.  Ultrasound has limitations in accurately estimating fetal  weights.  BP at our office: 120/60 mm Hg . ---------------------------------------------------------------------- Recommendations  -An appointment was made for her to return in  5 weeks for  fetal growth assessment. ----------------------------------------------------------------------                  Noralee Space, MD Electronically Signed Final Report   01/17/2021 08:12 am ----------------------------------------------------------------------   Assessment and Plan:  Pregnancy: G2P1001 at [redacted]w[redacted]d 1. Encounter for supervision of normal pregnancy, antepartum, unspecified gravidity Does not have working BP  cuff at home.  Advised to bring to her next visit so it can be checked.  No BP today. Having numbness in left leg - reassured it will resolve after delivery and is likely occurring due to the position of the baby. In person visit next week for vaginal swabs Needs to see midwife for consent for waterbirth Seen in [facility]U since last visit and chart reviewed Unable to talk muscle relaxers when at work due to side effect of drowsiness  2. Obesity during pregnancy, antepartum  3. Carpal tunnel bilateral Advised to get stiff wrist braces and wear at night    Preterm labor symptoms and general obstetric precautions including but not limited to vaginal bleeding, contractions, leaking of fluid and fetal movement were reviewed in detail with the patient. I discussed the assessment and treatment plan with the patient. The patient was provided an opportunity to ask questions and all were answered. The patient agreed with the plan and demonstrated an understanding of the instructions. The patient was advised to call back or seek an in-person office evaluation/go to [facility]U at [facility] for any urgent or concerning symptoms. Please refer to After Visit Summary for other counseling recommendations.   I provided 5 minutes of face-to-face time during this encounter.  Return in about 1 week (around 02/11/2021) for in person ROB, must be with midwife for consent for waterbirth.  Future Appointments  Date Time Provider Department Center  02/12/2021  9:15 AM Macon Large, Jethro Bastos, MD [facility] None  02/21/2021  7:15 AM [facility] NURSE [facility]  02/21/2021  7:30 AM [facility] US3 [facility]    Nolene Bernheim, RN, MSN, NP-BC Nurse Practitioner, [facility] for [employer], [facility] 02/04/2021 4:34 PM

## 2021-02-12 ENCOUNTER — Encounter: Payer: BC Managed Care – PPO | Admitting: Obstetrics & Gynecology

## 2021-02-12 ENCOUNTER — Other Ambulatory Visit (HOSPITAL_COMMUNITY)
Admission: RE | Admit: 2021-02-12 | Discharge: 2021-02-12 | Disposition: A | Discharge status: Home / Self Care | Payer: BC Managed Care – PPO | Source: Ambulatory Visit | Attending: Advanced Practice Midwife | Admitting: Advanced Practice Midwife

## 2021-02-12 ENCOUNTER — Ambulatory Visit (INDEPENDENT_AMBULATORY_CARE_PROVIDER_SITE_OTHER): Payer: BC Managed Care – PPO

## 2021-02-12 ENCOUNTER — Other Ambulatory Visit: Payer: Self-pay

## 2021-02-12 VITALS — BP 128/84 | HR 101 | Wt 280.0 lb

## 2021-02-12 DIAGNOSIS — Z3A36 36 weeks gestation of pregnancy: Secondary | ICD-10-CM

## 2021-02-12 DIAGNOSIS — Z3493 Encounter for supervision of normal pregnancy, unspecified, third trimester: Secondary | ICD-10-CM | POA: Insufficient documentation

## 2021-02-12 DIAGNOSIS — Z3490 Encounter for supervision of normal pregnancy, unspecified, unspecified trimester: Secondary | ICD-10-CM

## 2021-02-12 DIAGNOSIS — O99210 Obesity complicating pregnancy, unspecified trimester: Secondary | ICD-10-CM

## 2021-02-12 NOTE — Progress Notes (Signed)
   LOW-RISK PREGNANCY OFFICE VISIT  Patient name: [name] MRN [id number]  Date of birth: Feb 09, 1986 Chief Complaint:   Routine Prenatal Visit  Subjective:   [name] is a 35 y.o. G10P1001 female at [redacted]w[redacted]d with an Estimated Date of Delivery: 03/11/21 being seen today for ongoing management of a low-risk pregnancy aeb has Obesity BMI=38.9; Encounter for supervision of normal pregnancy, antepartum; Alpha thalassemia silent carrier; and Obesity during pregnancy, antepartum on their problem list.  Patient presents today with no complaints.  Patient endorses fetal movement. Patient denies abdominal cramping or contractions.  Patient denies vaginal concerns including abnormal discharge, leaking of fluid, and bleeding.  Contractions: Not present. Vag. Bleeding: None.  Movement: Present.  Patient reports that she was told her EDD was May 17th and   Reviewed past medical,surgical, social, obstetrical and family history as well as problem list, medications and allergies.  Objective   Vitals:   02/12/21 1439  BP: 128/84  Pulse: (!) 101  Weight: 280 lb (127 kg)  Body mass index is 46.59 kg/m.  Total Weight Gain:45 lb (20.4 kg)         Physical Examination:   General appearance: Well appearing, and in no distress  Mental status: Alert, oriented to person, place, and time  Skin: Warm & dry  Cardiovascular: Normal heart rate noted  Respiratory: Normal respiratory effort, no distress  Abdomen: Soft, gravid, nontender, AGA with Fundal Height: 41 cm  Pelvic: Cervical exam deferred           Extremities: Edema: Trace  Fetal Status: Fetal Heart Rate (bpm): 144  Movement: Present   No results found for this or any previous visit (from the past 24 hour(s)).  Assessment & Plan:  Low-risk pregnancy of a 35 y.o., G2P1001 at [redacted]w[redacted]d with an Estimated Date of Delivery: 03/11/21   1. Encounter for supervision of normal pregnancy, antepartum, unspecified gravidity -Anticipatory guidance  for upcoming appts. -Patient to next appt in 1 weeks for an in-person visit. -Reviewed risks and exclusion criteria for WB.  Consents signed.   2. [redacted] weeks gestation of pregnancy -Doing well overall. -GBS cultures collected.  3. Obesity during pregnancy, antepartum -Taking bASA -TWG 45 lbs       Meds: No orders of the defined types were placed in this encounter.  Labs/procedures today:   Lab Orders     Strep Gp B NAA   Reviewed: Term labor symptoms and general obstetric precautions including but not limited to vaginal bleeding, contractions, leaking of fluid and fetal movement were reviewed in detail with the patient.  All questions were answered.  Follow-up: Return in about 1 week (around 02/19/2021) for LROB.  Orders Placed This Encounter  Procedures  . Strep Gp B NAA   Cherre Robins MSN, CNM 02/12/2021

## 2021-02-12 NOTE — Progress Notes (Signed)
ROB [redacted]w[redacted]d   CC: None

## 2021-02-13 ENCOUNTER — Inpatient Hospital Stay (HOSPITAL_COMMUNITY)
Admission: AD | Admit: 2021-02-13 | Discharge: 2021-02-15 | DRG: 798 | Disposition: A | Discharge status: Home / Self Care | Payer: BC Managed Care – PPO | Attending: Obstetrics & Gynecology | Admitting: Obstetrics & Gynecology

## 2021-02-13 ENCOUNTER — Encounter (HOSPITAL_COMMUNITY): Payer: Self-pay | Admitting: Obstetrics & Gynecology

## 2021-02-13 ENCOUNTER — Inpatient Hospital Stay (HOSPITAL_COMMUNITY): Payer: BC Managed Care – PPO | Admitting: Anesthesiology

## 2021-02-13 ENCOUNTER — Encounter (HOSPITAL_COMMUNITY)
Admission: AD | Disposition: A | Discharge status: Home / Self Care | Payer: Self-pay | Source: Home / Self Care | Attending: Obstetrics & Gynecology

## 2021-02-13 DIAGNOSIS — O3663X0 Maternal care for excessive fetal growth, third trimester, not applicable or unspecified: Secondary | ICD-10-CM | POA: Diagnosis present

## 2021-02-13 DIAGNOSIS — Z148 Genetic carrier of other disease: Secondary | ICD-10-CM

## 2021-02-13 DIAGNOSIS — O99214 Obesity complicating childbirth: Secondary | ICD-10-CM | POA: Diagnosis present

## 2021-02-13 DIAGNOSIS — Z3A36 36 weeks gestation of pregnancy: Secondary | ICD-10-CM | POA: Diagnosis not present

## 2021-02-13 DIAGNOSIS — D563 Thalassemia minor: Secondary | ICD-10-CM | POA: Diagnosis present

## 2021-02-13 DIAGNOSIS — O134 Gestational [pregnancy-induced] hypertension without significant proteinuria, complicating childbirth: Secondary | ICD-10-CM | POA: Diagnosis not present

## 2021-02-13 DIAGNOSIS — Z7982 Long term (current) use of aspirin: Secondary | ICD-10-CM

## 2021-02-13 DIAGNOSIS — O139 Gestational [pregnancy-induced] hypertension without significant proteinuria, unspecified trimester: Secondary | ICD-10-CM | POA: Diagnosis present

## 2021-02-13 DIAGNOSIS — Z20822 Contact with and (suspected) exposure to covid-19: Secondary | ICD-10-CM | POA: Diagnosis not present

## 2021-02-13 DIAGNOSIS — O42013 Preterm premature rupture of membranes, onset of labor within 24 hours of rupture, third trimester: Secondary | ICD-10-CM | POA: Diagnosis not present

## 2021-02-13 DIAGNOSIS — O4290 Premature rupture of membranes, unspecified as to length of time between rupture and onset of labor, unspecified weeks of gestation: Secondary | ICD-10-CM | POA: Diagnosis present

## 2021-02-13 DIAGNOSIS — O42913 Preterm premature rupture of membranes, unspecified as to length of time between rupture and onset of labor, third trimester: Principal | ICD-10-CM | POA: Diagnosis present

## 2021-02-13 DIAGNOSIS — E669 Obesity, unspecified: Secondary | ICD-10-CM | POA: Diagnosis present

## 2021-02-13 DIAGNOSIS — Z3490 Encounter for supervision of normal pregnancy, unspecified, unspecified trimester: Secondary | ICD-10-CM

## 2021-02-13 DIAGNOSIS — O99210 Obesity complicating pregnancy, unspecified trimester: Secondary | ICD-10-CM | POA: Diagnosis present

## 2021-02-13 HISTORY — PX: DILATION AND EVACUATION: SHX1459

## 2021-02-13 LAB — CBC
HCT: 37.6 % (ref 36.0–46.0)
Hemoglobin: 12.3 g/dL (ref 12.0–15.0)
MCH: 26.5 pg (ref 26.0–34.0)
MCHC: 32.7 g/dL (ref 30.0–36.0)
MCV: 81 fL (ref 80.0–100.0)
Platelets: 239 10*3/uL (ref 150–400)
RBC: 4.64 MIL/uL (ref 3.87–5.11)
RDW: 13.4 % (ref 11.5–15.5)
WBC: 9.8 10*3/uL (ref 4.0–10.5)
nRBC: 0 % (ref 0.0–0.2)

## 2021-02-13 LAB — COMPREHENSIVE METABOLIC PANEL
ALT: 17 U/L (ref 0–44)
AST: 14 U/L — ABNORMAL LOW (ref 15–41)
Albumin: 2.8 g/dL — ABNORMAL LOW (ref 3.5–5.0)
Alkaline Phosphatase: 96 U/L (ref 38–126)
Anion gap: 10 (ref 5–15)
BUN: 7 mg/dL (ref 6–20)
CO2: 22 mmol/L (ref 22–32)
Calcium: 10 mg/dL (ref 8.9–10.3)
Chloride: 104 mmol/L (ref 98–111)
Creatinine, Ser: 0.57 mg/dL (ref 0.44–1.00)
GFR, Estimated: >60 mL/min (ref >60)
Glucose, Bld: 102 mg/dL — ABNORMAL HIGH (ref 70–99)
Potassium: 4.2 mmol/L (ref 3.5–5.1)
Sodium: 136 mmol/L (ref 135–145)
Total Bilirubin: 0.3 mg/dL (ref 0.3–1.2)
Total Protein: 6.3 g/dL — ABNORMAL LOW (ref 6.5–8.1)

## 2021-02-13 LAB — RESP PANEL BY RT-PCR (FLU A&B, COVID) ARPGX2
Influenza A by PCR: NEGATIVE
Influenza B by PCR: NEGATIVE
SARS Coronavirus 2 by RT PCR: NEGATIVE

## 2021-02-13 LAB — PROTEIN / CREATININE RATIO, URINE
Creatinine, Urine: 89.02 mg/dL
Protein Creatinine Ratio: 0.13 mg/mg{Cre} (ref 0.00–0.15)
Total Protein, Urine: 12 mg/dL

## 2021-02-13 LAB — TYPE AND SCREEN
ABO/RH(D): A POS
Antibody Screen: NEGATIVE

## 2021-02-13 LAB — CERVICOVAGINAL ANCILLARY ONLY
Chlamydia: NEGATIVE
Comment: NEGATIVE
Comment: NEGATIVE
Comment: NORMAL
Neisseria Gonorrhea: NEGATIVE
Trichomonas: NEGATIVE

## 2021-02-13 LAB — GROUP B STREP BY PCR: Group B strep by PCR: NEGATIVE

## 2021-02-13 LAB — RPR: RPR Ser Ql: NONREACTIVE

## 2021-02-13 SURGERY — DILATION AND EVACUATION, UTERUS
Anesthesia: Epidural

## 2021-02-13 MED ORDER — SIMETHICONE 80 MG PO CHEW: 80.0000 mg | CHEWABLE_TABLET | ORAL | Status: DC | PRN

## 2021-02-13 MED ORDER — MISOPROSTOL 200 MCG PO TABS
ORAL_TABLET | ORAL | Status: AC
  Filled 2021-02-13: qty 1

## 2021-02-13 MED ORDER — EPINEPHRINE PF 1 MG/ML IJ SOLN
INTRAMUSCULAR | Status: AC
  Filled 2021-02-13: qty 1

## 2021-02-13 MED ORDER — PHENYLEPHRINE 40 MCG/ML (10ML) SYRINGE FOR IV PUSH (FOR BLOOD PRESSURE SUPPORT): 80.0000 ug | PREFILLED_SYRINGE | INTRAVENOUS | Status: DC | PRN

## 2021-02-13 MED ORDER — NITROGLYCERIN 0.4 MG/SPRAY TL SOLN
Status: AC
  Filled 2021-02-13: qty 4.9

## 2021-02-13 MED ORDER — SOD CITRATE-CITRIC ACID 500-334 MG/5ML PO SOLN
30.0000 mL | ORAL | Status: DC | PRN
  Administered 2021-02-13: 30 mL via ORAL
  Filled 2021-02-13: qty 15

## 2021-02-13 MED ORDER — ONDANSETRON HCL 4 MG/2ML IJ SOLN
INTRAMUSCULAR | Status: DC | PRN
  Administered 2021-02-13: 4 mg via INTRAVENOUS

## 2021-02-13 MED ORDER — FLEET ENEMA 7-19 GM/118ML RE ENEM
1.0000 | ENEMA | RECTAL | Status: DC | PRN
Start: 2021-02-13 — End: 2021-02-13

## 2021-02-13 MED ORDER — MISOPROSTOL 200 MCG PO TABS
1000.0000 ug | ORAL_TABLET | Freq: Once | ORAL | Status: AC
  Administered 2021-02-13: 1000 ug via RECTAL

## 2021-02-13 MED ORDER — OXYTOCIN-SODIUM CHLORIDE 30-0.9 UT/500ML-% IV SOLN
2.5000 [IU]/h | INTRAVENOUS | Status: DC
  Administered 2021-02-13: 30 [IU] via INTRAVENOUS
  Filled 2021-02-13: qty 500

## 2021-02-13 MED ORDER — OXYTOCIN BOLUS FROM INFUSION
333.0000 mL | Freq: Once | INTRAVENOUS | Status: AC
  Administered 2021-02-13: 333 mL via INTRAVENOUS

## 2021-02-13 MED ORDER — OXYCODONE HCL 5 MG/5ML PO SOLN: 5.0000 mg | Freq: Once | ORAL | Status: DC | PRN

## 2021-02-13 MED ORDER — TRANEXAMIC ACID-NACL 1000-0.7 MG/100ML-% IV SOLN
INTRAVENOUS | Status: DC | PRN
  Administered 2021-02-13: 1000 mg via INTRAVENOUS

## 2021-02-13 MED ORDER — LIDOCAINE-EPINEPHRINE (PF) 2 %-1:200000 IJ SOLN
INTRAMUSCULAR | Status: DC | PRN
  Administered 2021-02-13: 4 mL via EPIDURAL

## 2021-02-13 MED ORDER — TETANUS-DIPHTH-ACELL PERTUSSIS 5-2.5-18.5 LF-MCG/0.5 IM SUSY: 0.5000 mL | PREFILLED_SYRINGE | Freq: Once | INTRAMUSCULAR | Status: DC

## 2021-02-13 MED ORDER — WITCH HAZEL-GLYCERIN EX PADS: 1.0000 "application " | MEDICATED_PAD | CUTANEOUS | Status: DC | PRN

## 2021-02-13 MED ORDER — SENNOSIDES-DOCUSATE SODIUM 8.6-50 MG PO TABS
2.0000 | ORAL_TABLET | ORAL | Status: DC
  Administered 2021-02-14: 2 via ORAL
  Filled 2021-02-13 (×2): qty 2

## 2021-02-13 MED ORDER — LIDOCAINE HCL (PF) 2 % IJ SOLN
INTRAMUSCULAR | Status: AC
  Filled 2021-02-13: qty 5

## 2021-02-13 MED ORDER — FENTANYL CITRATE (PF) 100 MCG/2ML IJ SOLN
INTRAMUSCULAR | Status: DC | PRN
  Administered 2021-02-13 (×2): 50 ug via INTRAVENOUS

## 2021-02-13 MED ORDER — IBUPROFEN 600 MG PO TABS
600.0000 mg | ORAL_TABLET | Freq: Four times a day (QID) | ORAL | Status: DC
  Administered 2021-02-13 – 2021-02-15 (×6): 600 mg via ORAL
  Filled 2021-02-13 (×7): qty 1

## 2021-02-13 MED ORDER — LIDOCAINE HCL (PF) 1 % IJ SOLN: 30.0000 mL | INTRAMUSCULAR | Status: DC | PRN

## 2021-02-13 MED ORDER — LACTATED RINGERS IV SOLN: INTRAVENOUS | Status: DC

## 2021-02-13 MED ORDER — KETOROLAC TROMETHAMINE 30 MG/ML IJ SOLN
30.0000 mg | Freq: Once | INTRAMUSCULAR | Status: DC | PRN
Start: 2021-02-13 — End: 2021-02-13

## 2021-02-13 MED ORDER — DIPHENHYDRAMINE HCL 50 MG/ML IJ SOLN: 12.5000 mg | INTRAMUSCULAR | Status: DC | PRN

## 2021-02-13 MED ORDER — FENTANYL CITRATE (PF) 100 MCG/2ML IJ SOLN
100.0000 ug | INTRAMUSCULAR | Status: DC | PRN
  Administered 2021-02-13: 100 ug via INTRAVENOUS
  Filled 2021-02-13: qty 2

## 2021-02-13 MED ORDER — OXYCODONE HCL 5 MG PO TABS: 5.0000 mg | ORAL_TABLET | Freq: Once | ORAL | Status: DC | PRN

## 2021-02-13 MED ORDER — FENTANYL-BUPIVACAINE-NACL 0.5-0.125-0.9 MG/250ML-% EP SOLN
12.0000 mL/h | EPIDURAL | Status: DC | PRN
  Administered 2021-02-13: 12 mL/h via EPIDURAL
  Filled 2021-02-13: qty 250

## 2021-02-13 MED ORDER — PRENATAL MULTIVITAMIN CH
1.0000 | ORAL_TABLET | Freq: Every day | ORAL | Status: DC
  Administered 2021-02-14 – 2021-02-15 (×2): 1 via ORAL
  Filled 2021-02-13 (×2): qty 1

## 2021-02-13 MED ORDER — LIDOCAINE-EPINEPHRINE (PF) 2 %-1:200000 IJ SOLN
INTRAMUSCULAR | Status: DC | PRN
  Administered 2021-02-13 (×2): 5 mL via EPIDURAL

## 2021-02-13 MED ORDER — NITROGLYCERIN 0.4 MG/SPRAY TL SOLN
Status: DC | PRN
  Administered 2021-02-13 (×2): 2 via SUBLINGUAL

## 2021-02-13 MED ORDER — ONDANSETRON HCL 4 MG/2ML IJ SOLN: 4.0000 mg | INTRAMUSCULAR | Status: DC | PRN

## 2021-02-13 MED ORDER — COCONUT OIL OIL: 1.0000 "application " | TOPICAL_OIL | Status: DC | PRN

## 2021-02-13 MED ORDER — SODIUM CHLORIDE 0.9 % IV SOLN
2.0000 g | INTRAVENOUS | Status: AC
  Administered 2021-02-13: 2 g via INTRAVENOUS
  Filled 2021-02-13: qty 2

## 2021-02-13 MED ORDER — PROMETHAZINE HCL 25 MG/ML IJ SOLN: 6.2500 mg | INTRAMUSCULAR | Status: DC | PRN

## 2021-02-13 MED ORDER — LACTATED RINGERS IV SOLN
500.0000 mL | INTRAVENOUS | Status: DC | PRN
  Administered 2021-02-13: 500 mL via INTRAVENOUS

## 2021-02-13 MED ORDER — ZOLPIDEM TARTRATE 5 MG PO TABS: 5.0000 mg | ORAL_TABLET | Freq: Every evening | ORAL | Status: DC | PRN

## 2021-02-13 MED ORDER — ONDANSETRON HCL 4 MG/2ML IJ SOLN: 4.0000 mg | Freq: Four times a day (QID) | INTRAMUSCULAR | Status: DC | PRN

## 2021-02-13 MED ORDER — LACTATED RINGERS IV SOLN: INTRAVENOUS | Status: DC | PRN

## 2021-02-13 MED ORDER — EPHEDRINE 5 MG/ML INJ: 10.0000 mg | INTRAVENOUS | Status: DC | PRN

## 2021-02-13 MED ORDER — NITROGLYCERIN IN D5W 200-5 MCG/ML-% IV SOLN
INTRAVENOUS | Status: DC | PRN
  Administered 2021-02-13 (×2): 10 mg via INTRAVENOUS
  Administered 2021-02-13 (×2): 20 mg via INTRAVENOUS
  Administered 2021-02-13: 40 mg via INTRAVENOUS

## 2021-02-13 MED ORDER — DIPHENHYDRAMINE HCL 25 MG PO CAPS: 25.0000 mg | ORAL_CAPSULE | Freq: Four times a day (QID) | ORAL | Status: DC | PRN

## 2021-02-13 MED ORDER — STERILE WATER FOR IRRIGATION IR SOLN
Status: DC | PRN
  Administered 2021-02-13: 1000 mL

## 2021-02-13 MED ORDER — HYDROMORPHONE HCL 1 MG/ML IJ SOLN: 0.2500 mg | INTRAMUSCULAR | Status: DC | PRN

## 2021-02-13 MED ORDER — SODIUM CHLORIDE 0.9 % IV SOLN
INTRAVENOUS | Status: AC
  Filled 2021-02-13: qty 2

## 2021-02-13 MED ORDER — TRANEXAMIC ACID-NACL 1000-0.7 MG/100ML-% IV SOLN
INTRAVENOUS | Status: AC
  Filled 2021-02-13: qty 100

## 2021-02-13 MED ORDER — BUPIVACAINE HCL (PF) 0.5 % IJ SOLN
INTRAMUSCULAR | Status: AC
  Filled 2021-02-13: qty 30

## 2021-02-13 MED ORDER — ONDANSETRON HCL 4 MG/2ML IJ SOLN
INTRAMUSCULAR | Status: AC
  Filled 2021-02-13: qty 2

## 2021-02-13 MED ORDER — BENZOCAINE-MENTHOL 20-0.5 % EX AERO: 1.0000 "application " | INHALATION_SPRAY | CUTANEOUS | Status: DC | PRN

## 2021-02-13 MED ORDER — PENICILLIN G POT IN DEXTROSE 60000 UNIT/ML IV SOLN: 3.0000 10*6.[IU] | INTRAVENOUS | Status: DC

## 2021-02-13 MED ORDER — LACTATED RINGERS IV SOLN: 500.0000 mL | Freq: Once | INTRAVENOUS | Status: DC

## 2021-02-13 MED ORDER — SODIUM CHLORIDE 0.9 % IV SOLN
5.0000 10*6.[IU] | Freq: Once | INTRAVENOUS | Status: AC
  Administered 2021-02-13: 5 10*6.[IU] via INTRAVENOUS
  Filled 2021-02-13: qty 5

## 2021-02-13 MED ORDER — ACETAMINOPHEN 325 MG PO TABS
650.0000 mg | ORAL_TABLET | ORAL | Status: DC | PRN
  Administered 2021-02-13 – 2021-02-15 (×3): 650 mg via ORAL
  Filled 2021-02-13 (×3): qty 2

## 2021-02-13 MED ORDER — DIBUCAINE (PERIANAL) 1 % EX OINT: 1.0000 "application " | TOPICAL_OINTMENT | CUTANEOUS | Status: DC | PRN

## 2021-02-13 MED ORDER — OXYCODONE-ACETAMINOPHEN 5-325 MG PO TABS: 1.0000 | ORAL_TABLET | ORAL | Status: DC | PRN

## 2021-02-13 MED ORDER — OXYCODONE-ACETAMINOPHEN 5-325 MG PO TABS: 2.0000 | ORAL_TABLET | ORAL | Status: DC | PRN

## 2021-02-13 MED ORDER — OXYTOCIN-SODIUM CHLORIDE 30-0.9 UT/500ML-% IV SOLN
1.0000 m[IU]/min | INTRAVENOUS | Status: DC
  Administered 2021-02-13: 2 m[IU]/min via INTRAVENOUS

## 2021-02-13 MED ORDER — ACETAMINOPHEN 325 MG PO TABS: 650.0000 mg | ORAL_TABLET | ORAL | Status: DC | PRN

## 2021-02-13 MED ORDER — SODIUM CHLORIDE 0.9 % IR SOLN
Status: DC | PRN
  Administered 2021-02-13: 1

## 2021-02-13 MED ORDER — NITROGLYCERIN IN D5W 200-5 MCG/ML-% IV SOLN
INTRAVENOUS | Status: AC
  Filled 2021-02-13: qty 250

## 2021-02-13 MED ORDER — ONDANSETRON HCL 4 MG PO TABS: 4.0000 mg | ORAL_TABLET | ORAL | Status: DC | PRN

## 2021-02-13 MED ORDER — FENTANYL CITRATE (PF) 100 MCG/2ML IJ SOLN
INTRAMUSCULAR | Status: AC
  Filled 2021-02-13: qty 2

## 2021-02-13 SURGICAL SUPPLY — 20 items
CATH ROBINSON RED A/P 16FR (CATHETERS) ×2 IMPLANT
CLOTH BEACON ORANGE TIMEOUT ST (SAFETY) ×2 IMPLANT
DECANTER SPIKE VIAL GLASS SM (MISCELLANEOUS) ×2 IMPLANT
GLOVE BIOGEL PI IND STRL 7.0 (GLOVE) ×3 IMPLANT
GLOVE BIOGEL PI INDICATOR 7.0 (GLOVE) ×3
GLOVE ECLIPSE 7.0 STRL STRAW (GLOVE) ×2 IMPLANT
GOWN STRL REUS W/TWL LRG LVL3 (GOWN DISPOSABLE) ×4 IMPLANT
HOVERMATT SINGLE USE (MISCELLANEOUS) ×1 IMPLANT
KIT BERKELEY 1ST TRIMESTER 3/8 (MISCELLANEOUS) ×2 IMPLANT
NS IRRIG 1000ML POUR BTL (IV SOLUTION) ×2 IMPLANT
PACK VAGINAL MINOR WOMEN LF (CUSTOM PROCEDURE TRAY) ×2 IMPLANT
PAD OB MATERNITY 4.3X12.25 (PERSONAL CARE ITEMS) ×2 IMPLANT
PAD PREP 24X48 CUFFED NSTRL (MISCELLANEOUS) ×2 IMPLANT
SET BERKELEY SUCTION TUBING (SUCTIONS) ×2 IMPLANT
TOWEL OR 17X24 6PK STRL BLUE (TOWEL DISPOSABLE) ×4 IMPLANT
VACURETTE 10 RIGID CVD (CANNULA) IMPLANT
VACURETTE 6 ASPIR F TIP BERK (CANNULA) IMPLANT
VACURETTE 7MM CVD STRL WRAP (CANNULA) IMPLANT
VACURETTE 8 RIGID CVD (CANNULA) IMPLANT
VACURETTE 9 RIGID CVD (CANNULA) IMPLANT

## 2021-02-13 NOTE — Progress Notes (Signed)
    Faculty Practice OB/GYN Attending Note  Subjective:  Called to evaluate patient with retained placenta after delivery at 1619.  No progress despite administration of pitocin 215 minutes after delivery, fundal massage and cord traction.  Objective:  Blood pressure (!) 150/78, pulse 93, temperature 99 F (37.2 C), resp. rate 16, height 5\' 5"  (1.651 m), weight 127 kg, last menstrual period 05/31/2020. Gen: NAD HENT: Normocephalic, atraumatic Lungs: Normal respiratory effort Heart: Regular rate noted Abdomen: NT, fundus firm above umbilicus Cervix: 1.5 cm dilated at internal os, only able to feel cord, unable to palpate any part of placenta.   Assessment & Plan:  35 y.o. G2P1102 at s/p vaginal delivery at [redacted]w[redacted]d with retained placenta. Discussed attempt of placental manual delivery/extraction at bedside after administration of nitroglycerin, risk of retained placental fragments needing curettage. Patient is concerned about her discomfort, desires more analgesia through her epidural and wants to be in OR in case curettage is needed.  Dr. [redacted]w[redacted]d (Anesthsiology) and OR RN in charge notified.  Patient counseled about possible curettage, risks discussed included but not limited to:  bleeding, infection, injury to uterus or surrounding organs, need for additional procedures, possibility of intrauterine scarring, risk of retained products which may require further management and other postoperative/anesthesia complications.  Likelihood of success of complete evacuation of the uterus was discussed with the patient.  Written informed consent was obtained.  Preoperative prophylactic antibiotics and SCDs have been ordered and is on call to the OR.  To OR when ready.   Patient is aware that the case may have to be done by Dr. Hyacinth Meeker, my oncoming colleague. She is agreeable to this.   Shawnie Pons, MD, FACOG Obstetrician & Gynecologist, [facility] for [facility]

## 2021-02-13 NOTE — Lactation Note (Signed)
This note was copied from Dana baby's chart. Lactation Consultation Note  Patient Name: [name] [id number] Date: 02/13/2021 Reason for consult: Initial assessment;Late-preterm 34-36.6wks Age:33 hours  Initial visit to 6 hours old LPT infant of P2 mother. Parents are present during visit. FOI participating and supportive. Infant is skin to skin. Infant did not latch after delivery.  LC talked about hand expression, demonstrated technique, collected ~32mL and spoonfed infant. Mother requests assistance with latch. Set up support pillows for football latch to right breast. Several attempts but infant briefly suckles and then holds nipple in mouth.  Reviewed newborn behavior, feeding patterns and expectations with parents. Discussed "caring for LPTI" green sheet. Set up DEBP and reinforced frequency/use/care and milk storage. Mother collected ~85mL of EBM. FOI spoonfed ~32mL. Infant seems content. LC swaddled and back to bassinet.    Plan:   1-Breastfeeding on demand, ensuring Dana deep, comfortable latch.  2-Undressing infant and place skin to skin when ready to breastfeed 3-Keep infant awake during breastfeeding session: massaging breast, infant's hand/shoulder/feet 4-Preserve infant energy limiting feeding sessions to 30 min max.  5-Hand express or use DEBP for supplementation purposes. 6-Monitor voids and stools as signs good intake.  7-Contact LC as needed for feeds/support/concerns/questions   Maternal Data Has patient been taught Hand Expression?: Yes Does the patient have breastfeeding experience prior to this delivery?: No (mother tried feeding her first child but unsuccessful)  Feeding Mother's Current Feeding Choice: Breast Milk  LATCH Score Latch: Repeated attempts needed to sustain latch, nipple held in mouth throughout feeding, stimulation needed to elicit sucking reflex.  Audible Swallowing: None  Type of Nipple: Everted at rest and after stimulation (short shaft and  edema)  Comfort (Breast/Nipple): Soft / non-tender  Hold (Positioning): Assistance needed to correctly position infant at breast and maintain latch.  LATCH Score: 6   Lactation Tools Discussed/Used Tools: Pump Breast pump type: Double-Electric Breast Pump;Manual Pump Education: Setup, frequency, and cleaning;Milk Storage Reason for Pumping: stimulation and supplementation, LPTI Pumping frequency: Q3 Pumped volume: 5 mL  Interventions Interventions: Breast feeding basics reviewed;Assisted with latch;Skin to skin;Breast massage;Hand express;Adjust position;Pre-pump if needed;DEBP;Hand pump;Expressed milk;Position options;Support pillows;Education  Discharge Pump: Personal WIC Program: No  Consult Status Consult Status: Follow-up Date: 02/14/21 Follow-up type: In-patient    [name] [name] 02/13/2021, 10:37 PM

## 2021-02-13 NOTE — H&P (Signed)
OBSTETRIC ADMISSION HISTORY AND PHYSICAL  [name] is a 35 y.o. female G2P1001 with IUP at 57w6dby 8 wk u/s presenting for PPROM '@2300' /latent labor on 02/12/21. She reports +FMs, No LOF, no VB, no blurry vision, headaches or peripheral edema, and RUQ pain.  She plans on breast feeding. She is undecided for birth control. [facility] --->  Estimated Date of Delivery: 03/07/21  Sono:    01/17/21'@[redacted]w[redacted]d' , CWD, normal anatomy, cephalic presentation, anterior placental lie, 2418g, 93% EFW   Prenatal History/Complications:  Silent alpha thal carrier/SMA carrier (FOB not tested) LGA BMI 47 GBS unk  Past Medical History: Past Medical History:  Diagnosis Date  . Migraine     Past Surgical History: Past Surgical History:  Procedure Laterality Date  . WISDOM TOOTH EXTRACTION      Obstetrical History: OB History    Gravida  2   Para  1   Term  1   Preterm  0   AB  0   Living  1     SAB  0   IAB  0   Ectopic  0   Multiple  0   Live Births  1           Social History Social History   Socioeconomic History  . Marital status: Single    Spouse name: Not on file  . Number of children: Not on file  . Years of education: 115 . Highest education level: Not on file  Occupational History  . Not on file  Tobacco Use  . Smoking status: Never Smoker  . Smokeless tobacco: Never Used  Vaping Use  . Vaping Use: Never used  Substance and Sexual Activity  . Alcohol use: Not Currently    Comment: Last drink 05/2020  . Drug use: No  . Sexual activity: Yes    Partners: Male    Birth control/protection: Other-see comments, None    Comment: pregnant  Other Topics Concern  . Not on file  Social History Narrative  . Not on file   Social Determinants of Health   Financial Resource Strain: Not on file  Food Insecurity: Not on file  Transportation Needs: Not on file  Physical Activity: Not on file  Stress:  Not on file  Social Connections: Not on file    Family History: Family History  Problem Relation Age of Onset  . Breast cancer Mother   . Heart disease Father   . Breast cancer Maternal Grandmother     Allergies: No Known Allergies  Medications Prior to Admission  Medication Sig Dispense Refill Last Dose  . aspirin EC 81 MG tablet Take 1 tablet (81 mg total) by mouth daily. 60 tablet 2 02/13/2021 at Unknown time  . Prenatal MV-Min-FA-Omega-3 (PRENATAL GUMMIES/DHA & FA) 0.4-32.5 MG CHEW Chew by mouth.   02/13/2021 at Unknown time  . Blood Pressure Monitoring (BLOOD PRESSURE KIT) KIT 1 kit by Does not apply route once a week. 1 kit 0   . cyclobenzaprine (FLEXERIL) 5 MG tablet Take 1 tablet (5 mg total) by mouth 3 (three) times daily as needed (headache). (Patient not taking: Reported on 02/12/2021) 20 tablet 0   . Elastic Bandages & Supports (WRIST BRACE/LEFT MEDIUM) MISC Wrist splint for carpal tunnel - size to clieint's need 1 each 0   . Elastic Bandages & Supports (WRIST BRACE/RIGHT MEDIUM) MISC Wrist splint to right wrist for carpal tunnel.  Size to client's need. 1 each 0   . metoCLOPramide (REGLAN) 10 MG tablet Take 1 tablet (10 mg total) by mouth every 8 (eight) hours as needed (take with tylenol for headache). (Patient not taking: Reported on 02/12/2021) 30 tablet 0   . Probiotic Product (PROBIOTIC-10) CHEW Chew by mouth. (Patient not taking: Reported on 02/12/2021)        Review of Systems   All systems reviewed and negative except as stated in HPI  Last menstrual period 05/31/2020. General appearance: alert, cooperative and mild distress Lungs: normal respiratory effort Heart: regular rate and rhythm Abdomen: soft, non-tender; gravid Pelvic: as noted below Extremities: Homans sign is negative, no sign of DVT Presentation: cephalic by MAU provider exam Fetal monitoringBaseline: 150 bpm, Variability: Good {> 6 bpm), Accelerations: Reactive and Decelerations: Absent Uterine  activityFrequency: Every 1 minutes     Prenatal labs: ABO, Rh: A/Positive/-- (10/14 1324) Antibody: Negative (10/14 1324) Rubella: 2.67 (10/14 1324) RPR: Non Reactive (02/16 1014)  HBsAg: Negative (10/14 1324)  HIV: Non Reactive (02/16 1014)  GBS:   unk, collected 02/12/21 2 hr Glucola passed Genetic screening silent alpha thal carrier, SMA carrier, otherwise normal Anatomy US LGA, otherwise normal  Prenatal Transfer Tool  Maternal Diabetes: No Genetic Screening: Abnormal:  Results: Other:as noted above Maternal Ultrasounds/Referrals: Normal Fetal Ultrasounds or other Referrals:  None Maternal Substance Abuse:  No Significant Maternal Medications:  None Significant Maternal Lab Results: Other: GBS unk  No results found for this or any previous visit (from the past 24 hour(s)).  Patient Active Problem List   Diagnosis Date Noted  . Genetic carrier 02/13/2021  . Obesity during pregnancy, antepartum 01/15/2021  . Alpha thalassemia silent carrier 10/11/2020  . Encounter for supervision of normal pregnancy, antepartum 08/01/2020  . Obesity 04/04/2020    Assessment/Plan:  [name] is a 35 y.o. G2P1001 at 25w6dhere for PPROM/latent labor '@2300'  02/12/21.  #Labor: Continue expectant management. Consider augmentation if no cervical change. #Pain: PRN, desires epidural #FWB:  cat 1 #ID: GBS unk, given preterm will treat with PCN #MOF: breast #MOC: POPs vs depo, counseled, still undecided #Circ: yes #LGA: EFW 93%ile, 2418g '@[redacted]w[redacted]d' . #BMI 4[place] MD  02/13/2021, 2:02 AM

## 2021-02-13 NOTE — Plan of Care (Signed)
Keyundra Fant, RN 

## 2021-02-13 NOTE — Progress Notes (Signed)
Labor Progress Note [name] is a 35 y.o. G2P1001 at [redacted]w[redacted]d presented for presented for PPROM and latent labor. S: Patient currently feels comfortable.  Is experiencing contractions.  O:  BP (!) 122/48   Pulse 87   Temp 99 F (37.2 C)   Resp 16   Ht 5\' 5"  (1.651 m)   Wt 127 kg   LMP 05/31/2020   BMI 46.59 kg/m  EFM: 150/ Moderate/ + Accelerations/ + Variable Deccels  CVE: Dilation: 9 Effacement (%): 80 Station: 0 Presentation: Vertex Exam by:: [email or web address], CNM   A&P: 35 y.o. G2P1001 [redacted]w[redacted]d presented for presented for PPROM and latent labor.  #Labor: Started on Pitocin at 1300.  Placing IUPC to better trace contractions.  Will consider Amnioinfusion if Variables continue.   #Pain: Epidural Placed #FWB: Category 2 #GBS PCR negative, Culture Pending, Penicillin stopped, will not plan on restarting #LGA: EFW 93%ile, 2418g @[redacted]w[redacted]d . #GHTN:  Pre-E labs normal.  Single reading >160.  Currently WNL  Continue to monitor. #BMI 47  [redacted]w[redacted]d, MD 3:34 PM

## 2021-02-13 NOTE — MAU Note (Signed)
Reports to MAU reporting a gush of fluid around 11pm. Reports contractions 1-62mins. Denies vaginal bleeding. +FM

## 2021-02-13 NOTE — Progress Notes (Signed)
[name] MRN: [id number]  Subjective: -Patient pushing.  Reports comfort with epidural.    Objective: BP (!) 122/48   Pulse 87   Temp 99 F (37.2 C)   Resp 16   Ht 5\' 5"  (1.651 m)   Wt 127 kg   LMP 05/31/2020   BMI 46.59 kg/m  No intake/output data recorded. No intake/output data recorded.  Fetal Monitoring: FHT: 140 bpm, Mod Var, -Decels, +Accels UC: Qmin, palpates moderate    Vaginal Exam: SVE:   Dilation: 9 Effacement (%): 80 Station: 0 Exam by:: Bhambri, CNM Membranes:SROM x 18hrs Internal Monitors: IUPC   Augmentation/Induction: Pitocin:71mUn/min Cytotec: None  Assessment:  IUP at 36.6 weeks Cat I FT  Second Stage Afebrile  Plan: -Cat I FT overall with some variables noted with pushing. -Resolution of variables with position changes.  -Will give O2 for additional support -Anticipate SVD   11m, CNM Advanced Practice Provider, [facility] for [facility] 02/13/2021, 3:57 PM

## 2021-02-13 NOTE — Anesthesia Preprocedure Evaluation (Signed)
Anesthesia Evaluation  Patient identified by MRN, date of birth, ID band Patient awake    Reviewed: Allergy & Precautions, NPO status , Patient's Chart, lab work & pertinent test results  Airway Mallampati: III  TM Distance: >3 FB Neck ROM: Full    Dental no notable dental hx.    Pulmonary neg pulmonary ROS,    Pulmonary exam normal breath sounds clear to auscultation       Cardiovascular negative cardio ROS Normal cardiovascular exam Rhythm:Regular Rate:Normal     Neuro/Psych  Headaches, negative psych ROS   GI/Hepatic negative GI ROS, Neg liver ROS,   Endo/Other  Morbid obesity (BMI 47)  Renal/GU negative Renal ROS  negative genitourinary   Musculoskeletal negative musculoskeletal ROS (+)   Abdominal   Peds  Hematology negative hematology ROS (+)   Anesthesia Other Findings Presents with PPROM and latent labor  Reproductive/Obstetrics (+) Pregnancy                             Anesthesia Physical Anesthesia Plan  ASA: III  Anesthesia Plan: Epidural   Post-op Pain Management:    Induction:   PONV Risk Score and Plan: Treatment may vary due to age or medical condition  Airway Management Planned: Natural Airway  Additional Equipment:   Intra-op Plan:   Post-operative Plan:   Informed Consent: I have reviewed the patients History and Physical, chart, labs and discussed the procedure including the risks, benefits and alternatives for the proposed anesthesia with the patient or authorized representative who has indicated his/her understanding and acceptance.       Plan Discussed with: Anesthesiologist  Anesthesia Plan Comments: (Patient identified. Risks, benefits, options discussed with patient including but not limited to bleeding, infection, nerve damage, paralysis, failed block, incomplete pain control, headache, blood pressure changes, nausea, vomiting, reactions to  medication, itching, and post partum back pain. Confirmed with bedside nurse the patient's most recent platelet count. Confirmed with the patient that they are not taking any anticoagulation, have any bleeding history or any family history of bleeding disorders. Patient expressed understanding and wishes to proceed. All questions were answered. )        Anesthesia Quick Evaluation

## 2021-02-13 NOTE — Discharge Summary (Signed)
Postpartum Discharge Summary  Date of Service updated 02/15/21     Patient Name: [name] DOB: [birth year] MRN: [id number]  Date of admission: 02/13/2021 Delivery date:02/13/2021  Delivering provider: Gavin Kent  Date of discharge: 02/15/2021  Admitting diagnosis: Premature rupture of membranes [O42.90] Intrauterine pregnancy: [redacted]w[redacted]d    Secondary diagnosis:  Principal Problem:   Vaginal delivery Active Problems:   Obesity   Encounter for supervision of normal pregnancy, antepartum   Alpha thalassemia silent carrier   Obesity during pregnancy, antepartum   Genetic carrier   Premature rupture of membranes   Retained placenta w/o hemorrhage, delivered, current hospitalization   Gestational hypertension  Additional problems: None    Discharge diagnosis: Preterm Pregnancy Delivered and Gestational Hypertension                                              Post partum procedures:curettage  Augmentation: Pitocin Complications: None  Hospital course: Onset of Labor With Vaginal Delivery      35y.o. yo [id number] [id number] admitted in Latent Labor on 02/13/2021. She had pPROM at 2300 and progressed without intervention until 9cm.  She then remained unchanged for 4 hours and pitocin was initiated with IUPC and FSE.  Patient progressed to complete and required some interventions for variable decels. She pushed for ~20 minutes and delivered a VMI.  After 15 minutes placenta showing no signs of detachment and pitocin initiated.  After 30 minutes MD called to bedside and patient given options for Bedside vs OR D&E.    Membrane Rupture Time/Date: 11:00 PM ,02/12/2021   Delivery Method:Vaginal, Spontaneous  Episiotomy: None  Lacerations:    Patient had an uncomplicated postpartum course.  She is ambulating, tolerating a regular diet, passing flatus, and urinating well. Patient is discharged home in stable condition on 02/15/21.  Newborn Data: Birth date:02/13/2021  Birth  time:4:19 PM  Gender:Female -[name]  Apgars:9 ,9  Weight:3035 g   Magnesium Sulfate received: No BMZ received: No Rhophylac:N/A MMR:N/A T-DaP: offered postpartum Flu: No Transfusion:No  Physical exam  Vitals:   02/14/21 0830 02/14/21 1916 02/14/21 2200 02/15/21 0550  BP: 129/76 136/90 132/89 134/79  Pulse: 78  76 87  Resp: '18 17 18   ' Temp: 98.4 F (36.9 C) 98.2 F (36.8 C) 98.3 F (36.8 C) 98.3 F (36.8 C)  TempSrc: Oral Axillary Oral Oral  SpO2:      Weight:      Height:       General: alert, cooperative and no distress Lochia: appropriate Uterine Fundus: firm Incision: N/A DVT Evaluation: No evidence of DVT seen on physical exam. Labs: Lab Results  Component Value Date   WBC 9.8 02/13/2021   HGB 12.3 02/13/2021   HCT 37.6 02/13/2021   MCV 81.0 02/13/2021   PLT 239 02/13/2021   CMP Latest Ref Rng & Units 02/13/2021  Glucose 70 - 99 mg/dL 102(H)  BUN 6 - 20 mg/dL 7  Creatinine 0.44 - 1.00 mg/dL 0.57  Sodium 135 - 145 mmol/L 136  Potassium 3.5 - 5.1 mmol/L 4.2  Chloride 98 - 111 mmol/L 104  CO2 22 - 32 mmol/L 22  Calcium 8.9 - 10.3 mg/dL 10.0  Total Protein 6.5 - 8.1 g/dL 6.3(L)  Total Bilirubin 0.3 - 1.2 mg/dL 0.3  Alkaline Phos 38 - 126 U/L 96  AST 15 -  41 U/L 14(L)  ALT 0 - 44 U/L 17   Edinburgh Score: Edinburgh Postnatal Depression Scale Screening Tool 02/14/2021  I have been able to laugh and see the funny side of things. 0  I have looked forward with enjoyment to things. 1  I have blamed myself unnecessarily when things went wrong. 1  I have been anxious or worried for no good reason. 3  I have felt scared or panicky for no good reason. 2  Things have been getting on top of me. 2  I have been so unhappy that I have had difficulty sleeping. 2  I have felt sad or miserable. 1  I have been so unhappy that I have been crying. 1  The thought of harming myself has occurred to me. 0  Edinburgh Postnatal Depression Scale Total 13      After visit meds:  Allergies as of 02/15/2021   No Known Allergies     Medication List    STOP taking these medications   aspirin EC 81 MG tablet   cyclobenzaprine 5 MG tablet Commonly known as: FLEXERIL   metoCLOPramide 10 MG tablet Commonly known as: REGLAN     TAKE these medications   acetaminophen 500 MG tablet Commonly known as: TYLENOL Take 2 tablets (1,000 mg total) by mouth every 6 (six) hours as needed (for pain scale < 4).   amLODipine 5 MG tablet Commonly known as: NORVASC Take 1 tablet (5 mg total) by mouth daily.   Blood Pressure Kit Kit 1 kit by Does not apply route once a week.   coconut oil Oil Apply 1 application topically as needed.   ibuprofen 600 MG tablet Commonly known as: ADVIL Take 1 tablet (600 mg total) by mouth every 6 (six) hours as needed.   norethindrone 0.35 MG tablet Commonly known as: Ortho Micronor Take 1 tablet (0.35 mg total) by mouth daily.   Prenatal Gummies/DHA & FA 0.4-32.5 MG Chew Chew by mouth.   Probiotic-10 Chew Chew by mouth.   Wrist Brace/Left Medium Misc Wrist splint for carpal tunnel - size to clieint's need   Wrist Brace/Right Medium Misc Wrist splint to right wrist for carpal tunnel.  Size to client's need.        Discharge home in stable condition Infant Feeding: Breast Infant Disposition:home with mother Discharge instruction: per After Visit Summary and Postpartum booklet. Activity: Advance as tolerated. Pelvic rest for 6 weeks.  Diet: routine diet Future Appointments: Future Appointments  Date Time Provider [place]  02/20/2021 10:20 AM [place] None  03/13/2021  9:55 AM [name], CNM [facility] None   Follow up Visit: Message Sent 02/13/2021   Please schedule this patient for a In person postpartum visit in 4 weeks with the following provider: J.Emly, CNM. Additional Postpartum F/U:BP check 1 week  Low risk pregnancy complicated by: HTN Delivery mode:  Vaginal,  Spontaneous  Anticipated Birth Control:  POPs, rx sent to pharmacy   [id number]/87/8676 Dana Senate, MD

## 2021-02-13 NOTE — Lactation Note (Signed)
Lactation Consultation Note  Patient Name: [name] [id number] Date: 02/13/2021 Reason for consult: L&D Initial assessment;Late-preterm 34-36.6wks Age:35 y.o.   LC Initial Visit (L & D):  Attempted to visit with family < 1 hour after birth, however, staff members attending to mother.  Reassured RN that follow up will be completed after mother is on the M/B unit.  Staff appreciative.   Maternal Data    Feeding Mother's Current Feeding Choice: Breast Milk  LATCH Score                    Lactation Tools Discussed/Used    Interventions    Discharge    Consult Status Consult Status: Follow-up Date: 02/13/21 Follow-up type: In-patient    Dora Sims 02/13/2021, 4:56 PM

## 2021-02-13 NOTE — Anesthesia Procedure Notes (Signed)
Epidural Patient location during procedure: OB Start time: 02/13/2021 5:00 AM End time: 02/13/2021 5:10 AM  Staffing Anesthesiologist: Elmer Picker, MD Performed: anesthesiologist   Preanesthetic Checklist Completed: patient identified, IV checked, risks and benefits discussed, monitors and equipment checked, pre-op evaluation and timeout performed  Epidural Patient position: sitting Prep: DuraPrep and site prepped and draped Patient monitoring: continuous pulse ox, blood pressure, heart rate and cardiac monitor Approach: midline Location: L3-L4 Injection technique: LOR air  Needle:  Needle type: Tuohy  Needle gauge: 17 G Needle length: 9 cm Needle insertion depth: 7 cm Catheter type: closed end flexible Catheter size: 19 Gauge Catheter at skin depth: 12 cm Test dose: negative  Assessment Sensory level: T8 Events: blood not aspirated, injection not painful, no injection resistance, no paresthesia and negative IV test  Additional Notes Patient identified. Risks/Benefits/Options discussed with patient including but not limited to bleeding, infection, nerve damage, paralysis, failed block, incomplete pain control, headache, blood pressure changes, nausea, vomiting, reactions to medication both or allergic, itching and postpartum back pain. Confirmed with bedside nurse the patient's most recent platelet count. Confirmed with patient that they are not currently taking any anticoagulation, have any bleeding history or any family history of bleeding disorders. Patient expressed understanding and wished to proceed. All questions were answered. Sterile technique was used throughout the entire procedure. Please see nursing notes for vital signs. Test dose was given through epidural catheter and negative prior to continuing to dose epidural or start infusion. Warning signs of high block given to the patient including shortness of breath, tingling/numbness in hands, complete motor block,  or any concerning symptoms with instructions to call for help. Patient was given instructions on fall risk and not to get out of bed. All questions and concerns addressed with instructions to call with any issues or inadequate analgesia.  Reason for block:procedure for pain

## 2021-02-13 NOTE — Transfer of Care (Signed)
Immediate Anesthesia Transfer of Care Note  Patient: [name]  Procedure(s) Performed: DILATATION AND EVACUATION (N/A )  Patient Location: PACU  Anesthesia Type:Epidural  Level of Consciousness: awake  Airway & Oxygen Therapy: Patient Spontanous Breathing  Post-op Assessment: Report given to RN and Post -op Vital signs reviewed and stable  Post vital signs: Reviewed and stable  Last Vitals:  Vitals Value Taken Time  BP    Temp    Pulse 98 02/13/21 1807  Resp 20 02/13/21 1807  SpO2 98 % 02/13/21 1806  Vitals shown include unvalidated device data.  Last Pain:  Vitals:   02/13/21 1656  TempSrc: Oral  PainSc:          Complications: No complications documented.

## 2021-02-13 NOTE — Op Note (Signed)
PROCEDURE DATE: 02/13/2021  PREOPERATIVE DIAGNOSIS: Retained placenta  POSTOPERATIVE DIAGNOSIS: The same  PROCEDURE:     Dilation and Evacuation.  SURGEON:  Karmen Altamirano S  INDICATIONS: 35 y.o. G2P1001 @ [redacted]w[redacted]d with recent SVD of VMI  with retained placenta. Risks of surgery were discussed with the patient including but not limited to: bleeding which may require transfusion; infection which may require antibiotics; injury to uterus or surrounding organs.    FINDINGS:  An open cervical os at 1-2 cm  ANESTHESIA:  Anitra Lauth, epidural  ESTIMATED BLOOD LOSS:  250 cc  SPECIMENS:  Placenta with patient  COMPLICATIONS:  None immediate.  PROCEDURE DETAILS:  The patient was then taken to the operating room where spinal anesthesia was administered and was found to be adequate.  After an adequate timeout was performed, she was placed in the dorsal lithotomy position and examined; then prepped and draped in the sterile manner.  Her bladder was catheterized for an unmeasured amount of clear, yellow urine. Patient required Nitroglycerine x 3 to relax the uterus and then was able to grip placenta and remove manually. A sharp curettage was then performed until a gritty texture was found in all four quadrants to ensure no placental parts remained. Bimanual massage, Pitocin and Cytotec rectally given for uterine contraction post delivery. Patient received TXA as well. There was minimal bleeding noted and a firm uterus was found on bimanual massage. The patient tolerated the procedure well.  The patient was taken to the recovery area in stable condition.  Reva Bores, MD 02/13/2021, 6:14 PM

## 2021-02-13 NOTE — Progress Notes (Addendum)
Labor Progress Note SHANDELL GIOVANNI is a 35 y.o. G2P1001 at [redacted]w[redacted]d presented for PPROM @2300 /latent labor.  S: Patient feeling well at this point.  O:  BP 136/65   Pulse 99   Temp 98.1 F (36.7 C) (Oral)   Resp 18   Ht 5\' 5"  (1.651 m)   Wt 127 kg   LMP 05/31/2020   BMI 46.59 kg/m  EFM: 150/Moderate/+ Accelerations, - Deccererations  CVE: Dilation: 9 Effacement (%): 80 Station: 0 Presentation: Vertex Exam by:: 07/31/2020, RN   A&P: 35 y.o. G2P1001 [redacted]w[redacted]d  presented for PPROM @2300 /latent labor. #Labor: Progressing well. No current need for augmentation #Pain: Epidural placed #FWB: Category 1 #GBS: PCR negative, Culture Pending, Penicillin stopped, will not plan on restarting #LGA: EFW 93%ile, 2418g @[redacted]w[redacted]d . #GHTN:  Pre-E labs normal.  Single reading >160.  Currently WNL  Continue to monitor. #BMI 47   34, MD 11:10 AM  Midwife attestation I agree with the documentation in the resident's note.   [redacted]w[redacted]d, CNM 2:34 PM

## 2021-02-14 ENCOUNTER — Encounter (HOSPITAL_COMMUNITY): Payer: Self-pay | Admitting: Family Medicine

## 2021-02-14 LAB — STREP GP B NAA: Strep Gp B NAA: NEGATIVE

## 2021-02-14 LAB — GLUCOSE, CAPILLARY: Glucose-Capillary: 100 mg/dL — ABNORMAL HIGH (ref 70–99)

## 2021-02-14 NOTE — Social Work (Signed)
CSW received consult for Edinburgh 13. CSW met with MOB to offer support and complete assessment.    CSW met with MOB at bedside. MOB congratulated and FOB. CSW offered MOB privacy. MOB agreeable for FOB to stay. MOB reports the infant was in the NICU because of a low blood sugar. CSW explain the reason for visit. MOB pleasant and receptive to the visit.  CSW inquired how MOB feels since giving birth. MOB reports, " I am doing good, feeling grateful we are doing ok."   CSW discussed the Edinburgh with MOB. MOB reports, I was feeling worried yesterday.  MOB reports, " I had the baby then I had to go to the OR to have the placenta removed." MOB reports she was worried about the infant's blood sugar however express hope that he will consume more milk with the donor supply. MOB reports she was also experiencing worry at home with her daughter who has issue with her mental health. MOB reports things are calmer at home while her daughter stays with her dad. CSW inquired about MOB supports. MOB reports her significant other, best friend and best friend relatives have all been supportive. MOB reference them as a village that help raise her 14-year-old daughter now the baby.   CSW assessed MOB for safety. MOB denies thoughts of harm to self and others. CSW inquired if MOB experienced PPD with her last birth. MOB reports she experienced postpartum because she felt like she was young and did not have as much support starting out.  CSW provided education regarding the baby blues period vs. perinatal mood disorders, discussed treatment and gave resources for mental health follow up.  CSW also recommended MOB complete a self-evaluation during the postpartum time period using the New Mom Checklist from Postpartum Progress and encouraged MOB to contact a medical professional if symptoms are noted. MOB receptive to the resources and check list.   CSW provided review of Sudden Infant Death Syndrome (SIDS) precautions and  informed MOB no co-sleeping with the infant. MOB reports understanding. CSW assessed if MOB has essential items for the infant. MOB reports she has essential items for the infant, including a bassinet and car seat.   CSW identifies no further need for intervention and no barriers to discharge at this time.  Raneshia Derick, MSW, LCSW [facility]  Clinical Social Worker  [phone] 02/14/2021  1:08 PM 

## 2021-02-14 NOTE — Progress Notes (Addendum)
Post Partum Day 1 Subjective:  Patient sitting in bed with infant on chest. Reports soreness to lower abdominal, pain meds controlling pain. Patient is pumping and feeding colostrum to infant .Baby going to nicu for low blood sugars. no complaints, up ad lib, voiding, tolerating PO and + flatus   Denies respiratory distress or breathing difficulty, chest pain, excessive bleeding, and leg pain.  Objective:  Blood pressure 124/71, pulse 78, temperature 98.2 F (36.8 C), temperature source Oral, resp. rate 19, height _0  (1.651 m), weight 127 kg, last menstrual period 05/31/2020, SpO2 97 %, unknown if currently breastfeeding.  Physical Exam:   General: alert, cooperative and no distress Lochia: appropriate Uterine Fundus: firm Incision: n/a DVT Evaluation: No evidence of DVT seen on physical exam. Negative Homan's sign. No cords or calf tenderness. No significant calf/ankle edema.  Recent Labs    02/13/21 0220  HGB 12.3  HCT 37.6    Assessment:  21w6dpostpartum vaginal delivery, retained placenta, lactating mother  Plan:  Anticipate discharge tomorrow  Desires circumcision for infant, discussed physicians would wait until blood sugars are under control  Contraception: POPs, patient desires to have another baby in a year.   LDaneil Dan SDamascus4/22/2022, 7:18 AM    I personally saw and evaluated the patient, performing the key elements of the service. I developed and verified the management plan that is described in the resident's/student's note, and I agree with the content with my edits above. VSS, HRR&R, Resp unlabored, Legs neg.  FNigel Berthold CNM 02/14/2021 8:14 AM

## 2021-02-14 NOTE — Anesthesia Preprocedure Evaluation (Signed)
Anesthesia Evaluation  Patient identified by MRN, date of birth, ID band Patient awake    Reviewed: Allergy & Precautions, NPO status , Patient's Chart, lab work & pertinent test results  Airway Mallampati: III  TM Distance: >3 FB Neck ROM: Full    Dental no notable dental hx.    Pulmonary neg pulmonary ROS,    Pulmonary exam normal breath sounds clear to auscultation       Cardiovascular negative cardio ROS Normal cardiovascular exam Rhythm:Regular Rate:Normal     Neuro/Psych  Headaches, negative psych ROS   GI/Hepatic negative GI ROS, Neg liver ROS,   Endo/Other  Morbid obesity (BMI 47)  Renal/GU negative Renal ROS  negative genitourinary   Musculoskeletal negative musculoskeletal ROS (+)   Abdominal (+) + obese,   Peds  Hematology negative hematology ROS (+)   Anesthesia Other Findings   Reproductive/Obstetrics                             Anesthesia Physical  Anesthesia Plan  ASA: III  Anesthesia Plan: Epidural   Post-op Pain Management:    Induction:   PONV Risk Score and Plan: 2 and Treatment may vary due to age or medical condition, Ondansetron and Midazolam  Airway Management Planned: Natural Airway  Additional Equipment:   Intra-op Plan:   Post-operative Plan:   Informed Consent: I have reviewed the patients History and Physical, chart, labs and discussed the procedure including the risks, benefits and alternatives for the proposed anesthesia with the patient or authorized representative who has indicated his/her understanding and acceptance.     Dental advisory given  Plan Discussed with: Anesthesiologist  Anesthesia Plan Comments: (Patient identified. Risks, benefits, options discussed with patient including but not limited to bleeding, infection, nerve damage, paralysis, failed block, incomplete pain control, headache, blood pressure changes, nausea, vomiting,  reactions to medication, itching, and post partum back pain. Confirmed with bedside nurse the patient's most recent platelet count. Confirmed with the patient that they are not taking any anticoagulation, have any bleeding history or any family history of bleeding disorders. Patient expressed understanding and wishes to proceed. All questions were answered. )        Anesthesia Quick Evaluation

## 2021-02-14 NOTE — Lactation Note (Signed)
This note was copied from a baby's chart. Lactation Consultation Note Attempted to see mom. Mom eating supper.  Patient Name: [name] ENIDP'O Date: 02/14/2021   Age:33 hours  Maternal Data    Feeding    LATCH Score                    Lactation Tools Discussed/Used    Interventions    Discharge    Consult Status      [name] 02/14/2021, 10:20 PM

## 2021-02-14 NOTE — Anesthesia Postprocedure Evaluation (Signed)
Anesthesia Post Note  Patient: [name]  Procedure(s) Performed: DILATATION AND EVACUATION (N/A )     Patient location during evaluation: PACU Anesthesia Type: General Level of consciousness: awake and alert Pain management: pain level controlled Vital Signs Assessment: post-procedure vital signs reviewed and stable Respiratory status: spontaneous breathing, nonlabored ventilation and respiratory function stable Cardiovascular status: blood pressure returned to baseline and stable Postop Assessment: no apparent nausea or vomiting Anesthetic complications: no   No complications documented.  Last Vitals:  Vitals:   02/14/21 0029 02/14/21 0433  BP: 128/68 124/71  Pulse: 80 78  Resp: 18 19  Temp: 37.5 C 36.8 C  SpO2: 97%     Last Pain:  Vitals:   02/14/21 0500  TempSrc:   PainSc: 0-No pain                 Lowella Curb

## 2021-02-14 NOTE — Anesthesia Postprocedure Evaluation (Signed)
Anesthesia Post Note  Patient: [name]  Procedure(s) Performed: AN AD HOC LABOR EPIDURAL     Patient location during evaluation: Mother Baby Anesthesia Type: Epidural Level of consciousness: awake Pain management: satisfactory to patient Vital Signs Assessment: post-procedure vital signs reviewed and stable Respiratory status: spontaneous breathing Cardiovascular status: stable Anesthetic complications: no   No complications documented.  Last Vitals:  Vitals:   02/14/21 0029 02/14/21 0433  BP: 128/68 124/71  Pulse: 80 78  Resp: 18 19  Temp: 37.5 C 36.8 C  SpO2: 97%     Last Pain:  Vitals:   02/14/21 0500  TempSrc:   PainSc: 0-No pain   Pain Goal:                   [employer]

## 2021-02-15 ENCOUNTER — Encounter (HOSPITAL_COMMUNITY): Payer: Self-pay | Admitting: Obstetrics & Gynecology

## 2021-02-15 DIAGNOSIS — O139 Gestational [pregnancy-induced] hypertension without significant proteinuria, unspecified trimester: Secondary | ICD-10-CM | POA: Diagnosis present

## 2021-02-15 HISTORY — DX: Gestational (pregnancy-induced) hypertension without significant proteinuria, unspecified trimester: O13.9

## 2021-02-15 MED ORDER — ACETAMINOPHEN 500 MG PO TABS: 1000.0000 mg | ORAL_TABLET | Freq: Four times a day (QID) | ORAL | Status: DC | PRN

## 2021-02-15 MED ORDER — IBUPROFEN 600 MG PO TABS: 600.0000 mg | ORAL_TABLET | Freq: Four times a day (QID) | ORAL | 0 refills | Status: DC | PRN

## 2021-02-15 MED ORDER — AMLODIPINE BESYLATE 5 MG PO TABS
5.0000 mg | ORAL_TABLET | Freq: Every day | ORAL | 3 refills | Status: DC
  Filled 2021-02-15: qty 30, 30d supply, fill #0

## 2021-02-15 MED ORDER — ACETAMINOPHEN 500 MG PO TABS
1000.0000 mg | ORAL_TABLET | Freq: Four times a day (QID) | ORAL | Status: DC | PRN
Start: 2021-02-15 — End: 2021-02-15

## 2021-02-15 MED ORDER — NORETHINDRONE 0.35 MG PO TABS
1.0000 | ORAL_TABLET | Freq: Every day | ORAL | 11 refills | Status: DC
  Filled 2021-02-15: qty 28, 28d supply, fill #0

## 2021-02-15 MED ORDER — AMLODIPINE BESYLATE 5 MG PO TABS: 5.0000 mg | ORAL_TABLET | Freq: Every day | ORAL | 3 refills | Status: DC

## 2021-02-15 MED ORDER — NORETHINDRONE 0.35 MG PO TABS: 1.0000 | ORAL_TABLET | Freq: Every day | ORAL | 11 refills | Status: DC

## 2021-02-15 MED ORDER — COCONUT OIL OIL: 1.0000 "application " | TOPICAL_OIL | 0 refills | Status: DC | PRN

## 2021-02-15 MED ORDER — AMLODIPINE BESYLATE 5 MG PO TABS
5.0000 mg | ORAL_TABLET | Freq: Every day | ORAL | Status: DC
  Administered 2021-02-15: 5 mg via ORAL
  Filled 2021-02-15: qty 1

## 2021-02-15 MED ORDER — COCONUT OIL OIL
1.0000 | TOPICAL_OIL | 0 refills | Status: DC | PRN
Start: 2021-02-15 — End: 2021-02-15

## 2021-02-15 NOTE — Lactation Note (Signed)
This note was copied from a baby's chart. Lactation Consultation Note Baby 32 hrs old. Mom unable to latch baby. Mom has Large breast w/short shaft nipples w/stimulation. Flat at rest. Nipples flatten fairly quickly. Lt. Nipple everts more than Rt. Both compressible. Hand expressed colostrum to get baby to latch. Breast massage at intervals during feeding.  LC doesn't feel that baby is getting anything substantial from breast. Baby tongue thrusting nipple frequently. When baby finally does start suckling well he can hold nipple in mouth for feeding but that is rare. LC t-cup nipple and held the back of areola tissue to keep nipple in baby's mouth and he suckled and kept nipple in mouth.  I don't think a NS will work d/t nipple at the bottom of breast and wouldn't be able to stay on. Breast tissue soft.  Encouraged mom to pump. Pre-pumping to pull nipple out before latching. Gave mom shells to wear in am. Encouraged mom to cont. To supplement w/Donor BM as ordered from MD.  Call for assistance as needed.   Patient Name: [name] [id number] Date: 02/15/2021 Reason for consult: Follow-up assessment;Early term 37-38.6wks Age:63 hours  Maternal Data Has patient been taught Hand Expression?: Yes  Feeding Nipple Type: Nfant Slow Flow (purple)  LATCH Score Latch: Repeated attempts needed to sustain latch, nipple held in mouth throughout feeding, stimulation needed to elicit sucking reflex.  Audible Swallowing: None  Type of Nipple: Flat  Comfort (Breast/Nipple): Soft / non-tender  Hold (Positioning): Full assist, staff holds infant at breast  LATCH Score: 4   Lactation Tools Discussed/Used Tools: Shells;Pump Breast pump type: Double-Electric Breast Pump Reason for Pumping: supplementing/evert nipples Pumping frequency: Q3  Interventions Interventions: Breast feeding basics reviewed;Support pillows;Assisted with latch;Position options;Skin to skin;Breast  massage;Hand express;Shells;Pre-pump if needed;DEBP;Breast compression;Adjust position  Discharge [facility] Program: No  Consult Status Consult Status: Follow-up Date: 02/15/21 Follow-up type: In-patient    [name] 02/15/2021, 12:56 AM

## 2021-02-15 NOTE — Lactation Note (Signed)
This note was copied from a baby's chart. Lactation Consultation Note  Patient Name: Boy Kaydan Wilhoite [id number] Date: 02/15/2021 Reason for consult: Follow-up assessment;Early term 37-38.6wks;Infant weight loss;Difficult latch Age:33 hours  Visited with mom of 44 hours old ETI female, she's a P2 but not experienced BF. Baby was taken to the NICU last night due to hypoglycemia. He's now back and rooming in with mom. She reports having difficulty latching to the right breast; mom said baby just had a feeding, but asked LC if we could come back to visit her for the 2 pm feeding.  She has been pumping every 2 hours and got 44 ml of EBM, praised her for her efforts. Encouraged mom to continue pumping and supplementing afterwards according to LPI policy. All questions answered, mom is aware of LC OP services and will see lactation again around 2 pm.   Maternal Data    Feeding Mother's Current Feeding Choice: Breast Milk  LATCH Score                    Lactation Tools Discussed/Used Tools: Pump Breast pump type: Double-Electric Breast Pump Pump Education: Setup, frequency, and cleaning Reason for Pumping: LPI, difficult latch Pumping frequency: q 3 hours (but she's doing every 2 hours during the day) Pumped volume: 44 mL  Interventions Interventions: Breast feeding basics reviewed;Education  Discharge Pump: DEBP  Consult Status Consult Status: Follow-up Date: 02/16/21 Follow-up type: In-patient    Lourdez Mcgahan Venetia Constable 02/15/2021, 1:01 PM

## 2021-02-15 NOTE — Lactation Note (Signed)
This note was copied from a baby's chart. Lactation Consultation Note  Patient Name: [name] [id number] Date: 02/15/2021 Reason for consult: Follow-up assessment;Early term 37-38.6wks;Infant weight loss Age:33 hours  LC came back for latch assistance, took baby STS to left breast per mom's request and he still would have difficulty latching on, he would thrust his tongue and break the latch. LC started a NS # 24 and baby was able to latch after a couple of attempts, mom required lots of assistance due to carpal tunnel syndrome.   Baby fed for 18 minutes with a few audible swallows noted, colostrum was noted on the NS at the end of the feeding. LC had to keep compressing for baby to keep sucking, baby would only suck with stimulation. Mom and baby are going home today, reviewed discharge education, lactogenesis II, pumping schedule and supplementation guidelines for LPIs.  Feeding plan:  1. Encouraged mom to feed baby STS 8-12 times/24 hours or sooner if feeding cues are present. Will continue using NS # 24 PRN 2. She'll continue pumping every 2-3 hours after feedings at the breast 3.Parents understand they need to supplement after every feeding at the breast every 3 hours, they're also aware amounts will increase tomorrow + 30 ml/feeding  FOB present and very supportive. Parents reported all questions and concerns were answered, they're both aware of LC OP services and will call PRN.   Maternal Data    Feeding Mother's Current Feeding Choice: Breast Milk  LATCH Score Latch: Repeated attempts needed to sustain latch, nipple held in mouth throughout feeding, stimulation needed to elicit sucking reflex. (with NS # 24)  Audible Swallowing: A few with stimulation (colostrum noted on NS)  Type of Nipple: Flat  Comfort (Breast/Nipple): Soft / non-tender  Hold (Positioning): No assistance needed to correctly position infant at breast. (mom has carpal tunnel syndrome)  LATCH  Score: 7   Lactation Tools Discussed/Used Tools: Pump Breast pump type: Double-Electric Breast Pump Pump Education: Setup, frequency, and cleaning Reason for Pumping: LPI, difficult latch Pumping frequency: q 3 hours (but she's doing every 2 hours during the day) Pumped volume: 44 mL  Interventions Interventions: Breast feeding basics reviewed;Breast massage;Hand express;Assisted with latch;Support pillows;DEBP;Skin to skin;Breast compression;Education  Discharge Discharge Education: Engorgement and breast care;Warning signs for feeding baby Pump: DEBP  Consult Status Consult Status: Complete Date: 02/15/21 Follow-up type: Call as needed    Jagger Demonte Venetia Constable 02/15/2021, 2:43 PM

## 2021-02-17 ENCOUNTER — Other Ambulatory Visit (HOSPITAL_COMMUNITY): Payer: Self-pay

## 2021-02-18 ENCOUNTER — Ambulatory Visit: Payer: BC Managed Care – PPO

## 2021-02-18 ENCOUNTER — Other Ambulatory Visit: Payer: Self-pay

## 2021-02-18 VITALS — BP 122/82 | HR 90

## 2021-02-18 DIAGNOSIS — Z0130 Encounter for examination of blood pressure without abnormal findings: Secondary | ICD-10-CM

## 2021-02-18 NOTE — Progress Notes (Signed)
Subjective:  [name] is a 35 y.o. female here for BP check.   Hypertension ROS: taking medications as instructed, no medication side effects noted, no TIA's, no chest pain on exertion, no dyspnea on exertion and noting swelling of ankles.    Objective:  LMP 05/31/2020   Appearance alert, well appearing, and in no distress. General exam BP noted to be well controlled today in office.    Assessment:   Blood Pressure stable.   Plan:  Current treatment plan is effective, no change in therapy..  Educated patient on proper blood pressure monitoring at home. Patient advised to to call office if she continues to have elevated readings.  4/24 150/90 4/25 152/85  PPD: 0

## 2021-02-20 ENCOUNTER — Ambulatory Visit: Payer: BC Managed Care – PPO

## 2021-02-21 ENCOUNTER — Encounter: Payer: BC Managed Care – PPO | Admitting: Obstetrics

## 2021-02-21 ENCOUNTER — Ambulatory Visit: Payer: BC Managed Care – PPO

## 2021-02-27 ENCOUNTER — Telehealth: Payer: Self-pay

## 2021-02-27 ENCOUNTER — Other Ambulatory Visit: Payer: Self-pay

## 2021-02-27 ENCOUNTER — Encounter (HOSPITAL_COMMUNITY): Payer: Self-pay | Admitting: Obstetrics & Gynecology

## 2021-02-27 ENCOUNTER — Inpatient Hospital Stay (HOSPITAL_COMMUNITY)
Admission: AD | Admit: 2021-02-27 | Discharge: 2021-02-27 | Disposition: A | Discharge status: Home / Self Care | Payer: BC Managed Care – PPO | Attending: Obstetrics & Gynecology | Admitting: Obstetrics & Gynecology

## 2021-02-27 DIAGNOSIS — O1495 Unspecified pre-eclampsia, complicating the puerperium: Secondary | ICD-10-CM | POA: Diagnosis not present

## 2021-02-27 DIAGNOSIS — O1205 Gestational edema, complicating the puerperium: Secondary | ICD-10-CM | POA: Diagnosis not present

## 2021-02-27 DIAGNOSIS — O165 Unspecified maternal hypertension, complicating the puerperium: Secondary | ICD-10-CM | POA: Insufficient documentation

## 2021-02-27 LAB — CBC
HCT: 40.6 % (ref 36.0–46.0)
Hemoglobin: 13.1 g/dL (ref 12.0–15.0)
MCH: 26.7 pg (ref 26.0–34.0)
MCHC: 32.3 g/dL (ref 30.0–36.0)
MCV: 82.7 fL (ref 80.0–100.0)
Platelets: 243 10*3/uL (ref 150–400)
RBC: 4.91 MIL/uL (ref 3.87–5.11)
RDW: 13.2 % (ref 11.5–15.5)
WBC: 8.5 10*3/uL (ref 4.0–10.5)
nRBC: 0 % (ref 0.0–0.2)

## 2021-02-27 LAB — COMPREHENSIVE METABOLIC PANEL
ALT: 35 U/L (ref 0–44)
AST: 21 U/L (ref 15–41)
Albumin: 3.3 g/dL — ABNORMAL LOW (ref 3.5–5.0)
Alkaline Phosphatase: 58 U/L (ref 38–126)
Anion gap: 4 — ABNORMAL LOW (ref 5–15)
BUN: 9 mg/dL (ref 6–20)
CO2: 28 mmol/L (ref 22–32)
Calcium: 8.9 mg/dL (ref 8.9–10.3)
Chloride: 106 mmol/L (ref 98–111)
Creatinine, Ser: 0.7 mg/dL (ref 0.44–1.00)
GFR, Estimated: >60 mL/min (ref >60)
Glucose, Bld: 103 mg/dL — ABNORMAL HIGH (ref 70–99)
Potassium: 4 mmol/L (ref 3.5–5.1)
Sodium: 138 mmol/L (ref 135–145)
Total Bilirubin: 0.7 mg/dL (ref 0.3–1.2)
Total Protein: 6.5 g/dL (ref 6.5–8.1)

## 2021-02-27 LAB — BRAIN NATRIURETIC PEPTIDE: B Natriuretic Peptide: 14 pg/mL (ref 0.0–100.0)

## 2021-02-27 MED ORDER — FUROSEMIDE 20 MG PO TABS: 20.0000 mg | ORAL_TABLET | Freq: Every day | ORAL | 0 refills | Status: DC

## 2021-02-27 MED ORDER — KETOROLAC TROMETHAMINE 30 MG/ML IJ SOLN
30.0000 mg | Freq: Once | INTRAMUSCULAR | Status: AC
  Administered 2021-02-27: 30 mg via INTRAMUSCULAR
  Filled 2021-02-27: qty 1

## 2021-02-27 NOTE — MAU Provider Note (Signed)
History     CSN: [id number]  Arrival date and time: 02/27/21 0947   Event Date/Time   First Provider Initiated Contact with Patient 02/27/21 1044      Chief Complaint  Patient presents with  . Ankle Swelling   Ms. SAMANATHA BRAMMER is a 35 y.o. G2P1102 at [facility] who presents to MAU for lower limb edema. Patient reports a vaginal delivery on 02/13/2021 with retained placenta requiring operative removal immediately after. Patient had no BP issues during pregnancy, but did develop hypertension after delivery and was placed on amlodipine 63m daily, which she is still taking daily. Patient was advised to watch for swelling, which she reports was present at the hospital and has not improved, but has actually worsened. Patient reports reports she used to only have bilateral pedal edema, but now the swelling is from her feet through mid-calf and in her hands. Patient reports she knows her hands are swollen because her wedding rings do not fit. Patient reports the hand swelling started the week of delivery and has progressively gotten worse. Patient also reports a HA that started last night and she rates as 7/10. Patient reports a history of migraines. Patient reports she has not taken anything for her headache today. Patient reports this is the second headache she has had since delivery.   Pt denies VB, LOF, ctx, decreased FM, vaginal discharge/odor/itching. Pt denies N/V, abdominal pain, constipation, diarrhea, or urinary problems. Pt denies fever, chills, fatigue, sweating or changes in appetite. Pt denies SOB or chest pain. Pt denies dizziness, light-headedness, weakness.  Allergies? NKDA Current medications/supplements? PNV, magnesium Pregnant/postpartum/breastfeeding? breastfeeding Prenatal care provider? [facility], next appt 03/13/2021   OB History    Gravida  2   Para  2   Term  1   Preterm  1   AB  0   Living  2     SAB  0   IAB  0   Ectopic  0   Multiple  0   Live Births   2           Past Medical History:  Diagnosis Date  . Gestational hypertension 02/15/2021  . Migraine     Past Surgical History:  Procedure Laterality Date  . DILATION AND EVACUATION N/A 02/13/2021   Procedure: DILATATION AND EVACUATION;  Surgeon: PDonnamae Jude MD;  Location: [facility] LD ORS;  Service: Gynecology;  Laterality: N/A;  . WISDOM TOOTH EXTRACTION      Family History  Problem Relation Age of Onset  . Breast cancer Mother   . Heart disease Father   . Breast cancer Maternal Grandmother     Social History   Tobacco Use  . Smoking status: Never Smoker  . Smokeless tobacco: Never Used  Vaping Use  . Vaping Use: Never used  Substance Use Topics  . Alcohol use: Not Currently    Comment: Last drink 05/2020  . Drug use: No    Allergies: No Known Allergies  Medications Prior to Admission  Medication Sig Dispense Refill Last Dose  . amLODipine (NORVASC) 5 MG tablet Take 1 tablet (5 mg total) by mouth daily. 30 tablet 3 02/26/2021 at 1000  . Prenatal MV-Min-FA-Omega-3 (PRENATAL GUMMIES/DHA & FA) 0.4-32.5 MG CHEW Chew by mouth.   02/26/2021 at 1000  . acetaminophen (TYLENOL) 500 MG tablet Take 2 tablets (1,000 mg total) by mouth every 6 (six) hours as needed (for pain scale < 4). 30 tablet    . Blood Pressure Monitoring (BLOOD PRESSURE KIT)  KIT 1 kit by Does not apply route once a week. 1 kit 0   . coconut oil OIL Apply 1 application topically as needed.  0   . Elastic Bandages & Supports (WRIST BRACE/LEFT MEDIUM) MISC Wrist splint for carpal tunnel - size to clieint's need 1 each 0   . Elastic Bandages & Supports (WRIST BRACE/RIGHT MEDIUM) MISC Wrist splint to right wrist for carpal tunnel.  Size to client's need. 1 each 0   . ibuprofen (ADVIL) 600 MG tablet Take 1 tablet (600 mg total) by mouth every 6 (six) hours as needed. 30 tablet 0   . norethindrone (ORTHO MICRONOR) 0.35 MG tablet Take 1 tablet (0.35 mg total) by mouth daily. 28 tablet 11   . Probiotic Product  (PROBIOTIC-10) CHEW Chew by mouth.       Review of Systems  Constitutional: Negative for chills, diaphoresis, fatigue and fever.  Eyes: Negative for visual disturbance.  Respiratory: Negative for shortness of breath.   Cardiovascular: Negative for chest pain.  Gastrointestinal: Negative for abdominal pain, constipation, diarrhea, nausea and vomiting.  Genitourinary: Negative for dysuria, flank pain, frequency, pelvic pain, urgency, vaginal bleeding and vaginal discharge.  Musculoskeletal:       Bilateral lower limb edema  Neurological: Positive for headaches. Negative for dizziness, weakness and light-headedness.   Physical Exam   Blood pressure 121/84, pulse 82, temperature 98.5 F (36.9 C), temperature source Oral, resp. rate 19, height $RemoveBe'5\' 5"'RMEfgSyeh$  (1.651 m), weight 119.2 kg, SpO2 100 %, unknown if currently breastfeeding.  Patient Vitals for the past 24 hrs:  BP Temp Temp src Pulse Resp SpO2 Height Weight  02/27/21 1200 -- -- -- -- -- 100 % -- --  02/27/21 1145 121/84 -- -- 82 -- 99 % -- --  02/27/21 1130 117/77 -- -- 82 -- 100 % -- --  02/27/21 1115 122/79 -- -- 83 -- 99 % -- --  02/27/21 1108 127/82 -- -- 88 -- -- -- --  02/27/21 1026 138/83 -- -- -- -- -- -- --  02/27/21 1024 (!) 140/93 98.5 F (36.9 C) Oral 92 19 -- -- --  02/27/21 1005 131/88 98.2 F (36.8 C) Oral 93 20 -- -- --  02/27/21 0957 -- -- -- -- -- -- $Rem'5\' 5"'qKbW$  (1.651 m) 119.2 kg   Physical Exam Vitals and nursing note reviewed.  Constitutional:      General: She is not in acute distress.    Appearance: Normal appearance. She is not ill-appearing, toxic-appearing or diaphoretic.  HENT:     Head: Normocephalic and atraumatic.  Pulmonary:     Effort: Pulmonary effort is normal.  Musculoskeletal:     Right lower leg: Tenderness present. 1+ Pitting Edema present.     Left lower leg: Tenderness present. 1+ Pitting Edema present.     Comments: Edema extending from feet to mid-calf bilaterally.  Skin:    General: Skin  is warm and dry.  Neurological:     Mental Status: She is alert and oriented to person, place, and time.  Psychiatric:        Mood and Affect: Mood normal.        Behavior: Behavior normal.        Thought Content: Thought content normal.        Judgment: Judgment normal.    Results for orders placed or performed during the hospital encounter of 02/27/21 (from the past 24 hour(s))  Brain natriuretic peptide     Status: None   Collection Time:  02/27/21 11:02 AM  Result Value Ref Range   B Natriuretic Peptide 14.0 0.0 - 100.0 pg/mL  CBC     Status: None   Collection Time: 02/27/21 11:02 AM  Result Value Ref Range   WBC 8.5 4.0 - 10.5 K/uL   RBC 4.91 3.87 - 5.11 MIL/uL   Hemoglobin 13.1 12.0 - 15.0 g/dL   HCT 40.6 36.0 - 46.0 %   MCV 82.7 80.0 - 100.0 fL   MCH 26.7 26.0 - 34.0 pg   MCHC 32.3 30.0 - 36.0 g/dL   RDW 13.2 11.5 - 15.5 %   Platelets 243 150 - 400 K/uL   nRBC 0.0 0.0 - 0.2 %  Comprehensive metabolic panel     Status: Abnormal   Collection Time: 02/27/21 11:02 AM  Result Value Ref Range   Sodium 138 135 - 145 mmol/L   Potassium 4.0 3.5 - 5.1 mmol/L   Chloride 106 98 - 111 mmol/L   CO2 28 22 - 32 mmol/L   Glucose, Bld 103 (H) 70 - 99 mg/dL   BUN 9 6 - 20 mg/dL   Creatinine, Ser 0.70 0.44 - 1.00 mg/dL   Calcium 8.9 8.9 - 10.3 mg/dL   Total Protein 6.5 6.5 - 8.1 g/dL   Albumin 3.3 (L) 3.5 - 5.0 g/dL   AST 21 15 - 41 U/L   ALT 35 0 - 44 U/L   Alkaline Phosphatase 58 38 - 126 U/L   Total Bilirubin 0.7 0.3 - 1.2 mg/dL   GFR, Estimated >60 >60 mL/min   Anion gap 4 (L) 5 - 15   No results found.  MAU Course  Procedures  MDM -preeclampsia evaluation without severe range BP in MAU on admission -symptoms include: HA; 76m Toradol given -CBC: H/H 13.1/40.6, platelets 243 -BNP: WNL -CMP: serum creatinine 0.70, AST/ALT 21/35 -after medication administration, pt reports HA now much improved and rates as 4/10 -consulted with Dr. EDione Plover pt OK to be discharged home  with lasix 247mPO daily x3days for swelling, and can keep taking amlodipine 75m34mO daily, and f/u with [facility] as planned -pt discharged to home in stable condition  Orders Placed This Encounter  Procedures  . CBC    Standing Status:   Standing    Number of Occurrences:   1  . Comprehensive metabolic panel    Standing Status:   Standing    Number of Occurrences:   1  . Brain natriuretic peptide    Standing Status:   Standing    Number of Occurrences:   1  . Discharge patient    Order Specific Question:   Discharge disposition    Answer:   01-Home or Self Care [1]    Order Specific Question:   Discharge patient date    Answer:   02/27/2021   Meds ordered this encounter  Medications  . ketorolac (TORADOL) 30 MG/ML injection 30 mg  . furosemide (LASIX) 20 MG tablet    Sig: Take 1 tablet (20 mg total) by mouth daily for 3 days.    Dispense:  3 tablet    Refill:  0    Order Specific Question:   Supervising Provider    Answer:   ECKClarnce Flock0[[id number]]Assessment and Plan   1. Edema in pregnancy, postpartum condition   2. Postpartum hypertension     Allergies as of 02/27/2021   No Known Allergies     Medication List    TAKE these medications  acetaminophen 500 MG tablet Commonly known as: TYLENOL Take 2 tablets (1,000 mg total) by mouth every 6 (six) hours as needed (for pain scale < 4).   amLODipine 5 MG tablet Commonly known as: NORVASC Take 1 tablet (5 mg total) by mouth daily.   Blood Pressure Kit Kit 1 kit by Does not apply route once a week.   coconut oil Oil Apply 1 application topically as needed.   furosemide 20 MG tablet Commonly known as: Lasix Take 1 tablet (20 mg total) by mouth daily for 3 days.   ibuprofen 600 MG tablet Commonly known as: ADVIL Take 1 tablet (600 mg total) by mouth every 6 (six) hours as needed.   norethindrone 0.35 MG tablet Commonly known as: Ortho Micronor Take 1 tablet (0.35 mg total) by mouth daily.   Prenatal  Gummies/DHA & FA 0.4-32.5 MG Chew Chew by mouth.   Probiotic-10 Chew Chew by mouth.   Wrist Brace/Left Medium Misc Wrist splint for carpal tunnel - size to clieint's need   Wrist Brace/Right Medium Misc Wrist splint to right wrist for carpal tunnel.  Size to client's need.      -RX lasix -Reviewed warning blood pressure values (systolic = / > 367 and/or diastolic =/> 90). Explained that, if blood pressure is elevated, she should sit down, rest, and eat/drink something. If still elevated 15 minutes later, and she is greater than 20 weeks, she should call clinic or come to MAU. She should come to MAU if she has elevated pressures and any of the following:  -headache not relieved with tylenol, rest, hydration -blurry vision, floating spots in her vision -sudden full-body edema or facial edema -RUQ pain that is constant. -chest pain or shortness of breath -new onset or sudden worsening of nausea and vomiting These symptoms may indicate that her blood pressure is worsening and she may be developing gestational hypertension or pre-eclampsia, which is an emergency.  -return MAU precautions given -pt discharged to home in stable condition  Gerrie Nordmann Keaten Mashek 02/27/2021, 12:45 PM

## 2021-02-27 NOTE — Telephone Encounter (Signed)
Pt called triage line this morning stating that she is having bilateral leg pain and swelling that has been increasing over the past 3 days. Pt states that legs are swollen, red, hot to touch, and painful to walk on. States that she also has a headache. States she has taken BP at home and is usually high. Runs between 148-152 for systolic. She did not mention the dystolic readings. Pt states the swelling has never really gone down since she delivered. Pt states the redness and swelling extends up to the calf bilaterally. Advised patient to go to MAU for evaluation as soon as possible. Pt agreed and verbalized understanding.

## 2021-02-27 NOTE — MAU Note (Signed)
Presents with c/o bilateral ankle swelling, reports ankles are sore to the touch and hurt with walking.  Denies any problems with elevated BP during pregnancy, but started on BP meds post delivery.  Currently reports H/A since last night, hasn't taken any meds.  Denies visual disturbances. S/P NVD 02/13/2021.

## 2021-02-27 NOTE — Discharge Instructions (Signed)
Hypertension, Adult High blood pressure (hypertension) is when the force of blood pumping through the arteries is too strong. The arteries are the blood vessels that carry blood from the heart throughout the body. Hypertension forces the heart to work harder to pump blood and may cause arteries to become narrow or stiff. Untreated or uncontrolled hypertension can cause a heart attack, heart failure, a stroke, kidney disease, and other problems. A blood pressure reading consists of a higher number over a lower number. Ideally, your blood pressure should be below 120/80. The first ("top") number is called the systolic pressure. It is a measure of the pressure in your arteries as your heart beats. The second ("bottom") number is called the diastolic pressure. It is a measure of the pressure in your arteries as the heart relaxes. What are the causes? The exact cause of this condition is not known. There are some conditions that result in or are related to high blood pressure. What increases the risk? Some risk factors for high blood pressure are under your control. The following factors may make you more likely to develop this condition:  Smoking.  Having type 2 diabetes mellitus, high cholesterol, or both.  Not getting enough exercise or physical activity.  Being overweight.  Having too much fat, sugar, calories, or salt (sodium) in your diet.  Drinking too much alcohol. Some risk factors for high blood pressure may be difficult or impossible to change. Some of these factors include:  Having chronic kidney disease.  Having a family history of high blood pressure.  Age. Risk increases with age.  Race. You may be at higher risk if you are African American.  Gender. Men are at higher risk than women before age 45. After age 65, women are at higher risk than men.  Having obstructive sleep apnea.  Stress. What are the signs or symptoms? High blood pressure may not cause symptoms. Very high  blood pressure (hypertensive crisis) may cause:  Headache.  Anxiety.  Shortness of breath.  Nosebleed.  Nausea and vomiting.  Vision changes.  Severe chest pain.  Seizures. How is this diagnosed? This condition is diagnosed by measuring your blood pressure while you are seated, with your arm resting on a flat surface, your legs uncrossed, and your feet flat on the floor. The cuff of the blood pressure monitor will be placed directly against the skin of your upper arm at the level of your heart. It should be measured at least twice using the same arm. Certain conditions can cause a difference in blood pressure between your right and left arms. Certain factors can cause blood pressure readings to be lower or higher than normal for a short period of time:  When your blood pressure is higher when you are in a health care provider's office than when you are at home, this is called white coat hypertension. Most people with this condition do not need medicines.  When your blood pressure is higher at home than when you are in a health care provider's office, this is called masked hypertension. Most people with this condition may need medicines to control blood pressure. If you have a high blood pressure reading during one visit or you have normal blood pressure with other risk factors, you may be asked to:  Return on a different day to have your blood pressure checked again.  Monitor your blood pressure at home for 1 week or longer. If you are diagnosed with hypertension, you may have other blood or   imaging tests to help your health care provider understand your overall risk for other conditions. How is this treated? This condition is treated by making healthy lifestyle changes, such as eating healthy foods, exercising more, and reducing your alcohol intake. Your health care provider may prescribe medicine if lifestyle changes are not enough to get your blood pressure under control, and  if:  Your systolic blood pressure is above 130.  Your diastolic blood pressure is above 80. Your personal target blood pressure may vary depending on your medical conditions, your age, and other factors. Follow these instructions at home: Eating and drinking  Eat a diet that is high in fiber and potassium, and low in sodium, added sugar, and fat. An example eating plan is called the DASH (Dietary Approaches to Stop Hypertension) diet. To eat this way: ? Eat plenty of fresh fruits and vegetables. Try to fill one half of your plate at each meal with fruits and vegetables. ? Eat whole grains, such as whole-wheat pasta, brown rice, or whole-grain bread. Fill about one fourth of your plate with whole grains. ? Eat or drink low-fat dairy products, such as skim milk or low-fat yogurt. ? Avoid fatty cuts of meat, processed or cured meats, and poultry with skin. Fill about one fourth of your plate with lean proteins, such as fish, chicken without skin, beans, eggs, or tofu. ? Avoid pre-made and processed foods. These tend to be higher in sodium, added sugar, and fat.  Reduce your daily sodium intake. Most people with hypertension should eat less than 1,500 mg of sodium a day.  Do not drink alcohol if: ? Your health care provider tells you not to drink. ? You are pregnant, may be pregnant, or are planning to become pregnant.  If you drink alcohol: ? Limit how much you use to:  0-1 drink a day for women.  0-2 drinks a day for men. ? Be aware of how much alcohol is in your drink. In the [place] one drink equals one 12 oz bottle of beer (355 mL), one 5 oz glass of wine (148 mL), or one 1 oz glass of hard liquor (44 mL).   Lifestyle  Work with your health care provider to maintain a healthy body weight or to lose weight. Ask what an ideal weight is for you.  Get at least 30 minutes of exercise most days of the week. Activities may include walking, swimming, or biking.  Include exercise to  strengthen your muscles (resistance exercise), such as Pilates or lifting weights, as part of your weekly exercise routine. Try to do these types of exercises for 30 minutes at least 3 days a week.  Do not use any products that contain nicotine or tobacco, such as cigarettes, e-cigarettes, and chewing tobacco. If you need help quitting, ask your health care provider.  Monitor your blood pressure at home as told by your health care provider.  Keep all follow-up visits as told by your health care provider. This is important.   Medicines  Take over-the-counter and prescription medicines only as told by your health care provider. Follow directions carefully. Blood pressure medicines must be taken as prescribed.  Do not skip doses of blood pressure medicine. Doing this puts you at risk for problems and can make the medicine less effective.  Ask your health care provider about side effects or reactions to medicines that you should watch for. Contact a health care provider if you:  Think you are having a reaction to a   medicine you are taking.  Have headaches that keep coming back (recurring).  Feel dizzy.  Have swelling in your ankles.  Have trouble with your vision. Get help right away if you:  Develop a severe headache or confusion.  Have unusual weakness or numbness.  Feel faint.  Have severe pain in your chest or abdomen.  Vomit repeatedly.  Have trouble breathing. Summary  Hypertension is when the force of blood pumping through your arteries is too strong. If this condition is not controlled, it may put you at risk for serious complications.  Your personal target blood pressure may vary depending on your medical conditions, your age, and other factors. For most people, a normal blood pressure is less than 120/80.  Hypertension is treated with lifestyle changes, medicines, or a combination of both. Lifestyle changes include losing weight, eating a healthy, low-sodium diet,  exercising more, and limiting alcohol. This information is not intended to replace advice given to you by your health care provider. Make sure you discuss any questions you have with your health care provider. Document Revised: 06/22/2018 Document Reviewed: 06/22/2018 Elsevier Patient Education  2021 [employer]        Preeclampsia and Eclampsia Preeclampsia is a serious condition that may develop during pregnancy. This condition involves high blood pressure during pregnancy and causes symptoms such as headaches, vision changes, and increased swelling in the legs, hands, and face. Preeclampsia occurs after 20 weeks of pregnancy. Eclampsia is a seizure that happens from worsening preeclampsia. Diagnosing and managing preeclampsia early is important. If not treated early, it can cause serious problems for mother and baby. There is no cure for this condition. However, during pregnancy, delivering the baby may be the best treatment for preeclampsia or eclampsia. For most women, symptoms of preeclampsia and eclampsia go away after giving birth. In rare cases, a woman may develop preeclampsia or eclampsia after giving birth. This usually occurs within 48 hours after childbirth but may occur up to 6 weeks after giving birth. What are the causes? The cause of this condition is not known. What increases the risk? The following factors make you more likely to develop preeclampsia:  Being pregnant for the first time or being pregnant with multiples.  Having had preeclampsia or a condition called hemolysis, elevated liver enzymes, and low platelet count (HELLP)syndrome during a past pregnancy.  Having a family history of preeclampsia.  Being older than age 57.  Being obese.  Becoming pregnant through fertility treatments. Conditions that reduce blood flow or oxygen to your placenta and baby may also increase your risk. These include:  High blood pressure before, during, or immediately  following pregnancy.  Kidney disease.  Diabetes.  Blood clotting disorders.  Autoimmune diseases, such as lupus.  Sleep apnea. What are the signs or symptoms? Common symptoms of this condition include:  A severe, throbbing headache that does not go away.  Vision problems, such as blurred or double vision and light sensitivity.  Pain in the stomach, especially the right upper region.  Pain in the shoulder. Other symptoms that may develop as the condition gets worse include:  Sudden weight gain because of fluid buildup in the body. This causes swelling of the face, hands, legs, and feet.  Severe nausea and vomiting.  Urinating less than usual.  Shortness of breath.  Seizures. How is this diagnosed? Your health care provider will ask you about symptoms and check for signs of preeclampsia during your prenatal visits. You will also have routine tests, including:  Checking your blood pressure.  Urine tests to check for protein.  Blood tests to assess your organ function.  Monitoring your baby's heart rate.  Ultrasounds to check fetal growth.   How is this treated? You and your health care provider will determine the treatment that is best for you. Treatment may include:  Frequent prenatal visits to check for preeclampsia.  Medicine to lower your blood pressure.  Medicine to prevent seizures.  Low-dose aspirin during your pregnancy.  Staying in the hospital, in severe cases. You will be given medicines to control your blood pressure and the amount of fluids in your body.  Delivering your baby. Work with your health care provider to manage any chronic health conditions, such as diabetes or kidney problems. Also, work with your health care provider to manage weight gain during pregnancy. Follow these instructions at home: Eating and drinking  Drink enough fluid to keep your urine pale yellow.  Avoid caffeine. Caffeine may increase blood pressure and heart rate  and lead to dehydration.  Reduce the amount of salt that you eat. Lifestyle  Do not use any products that contain nicotine or tobacco. These products include cigarettes, chewing tobacco, and vaping devices, such as e-cigarettes. If you need help quitting, ask your health care provider.  Do not use alcohol or drugs.  Avoid stress as much as possible.  Rest and get plenty of sleep. General instructions  Take over-the-counter and prescription medicines only as told by your health care provider.  When lying down, lie on your left side. This keeps pressure off your major blood vessels.  When sitting or lying down, raise (elevate) your feet. Try putting pillows underneath your lower legs.  Exercise regularly. Ask your health care provider what kinds of exercise are best for you.  Check your blood pressure as often as recommended by your health care provider.  Keep all prenatal and follow-up visits. This is important.   Contact a health care provider if:  You have symptoms that may need treatment or closer monitoring. These include: ? Headaches. ? Stomach pain or nausea and vomiting. ? Shoulder pain. ? Vision problems, such as spots in front of your eyes or blurry vision. ? Sudden weight gain or increased swelling in your face, hands, legs, and feet. ? Increased anxiety or feeling of impending doom. ? Signs or symptoms of labor. Get help right away if:  You have any of the following symptoms: ? A seizure. ? Shortness of breath or trouble breathing. ? Trouble speaking or slurred speech. ? Fainting. ? Chest pain. These symptoms may represent a serious problem that is an emergency. Do not wait to see if the symptoms will go away. Get medical help right away. Call your local emergency services (911 in the [place] Do not drive yourself to the hospital. Summary  Preeclampsia is a serious condition that may develop during pregnancy.  Diagnosing and treating preeclampsia early is very  important.  Keep all prenatal and follow-up visits. This is important.  Get help right away if you have a seizure, shortness of breath or trouble breathing, trouble speaking or slurred speech, chest pain, or fainting. This information is not intended to replace advice given to you by your health care provider. Make sure you discuss any questions you have with your health care provider. Document Revised: 07/04/2020 Document Reviewed: 07/04/2020 Elsevier Patient Education  2021 [employer].

## 2021-03-07 IMAGING — US US MFM OB FOLLOW-UP
1 series · 14 of 28 positions shown · non-contrast
Comparison: none

[Series 1: us mfm ob follow-up · 45 acquisitions, 14 frames shown]
[im 2/45]
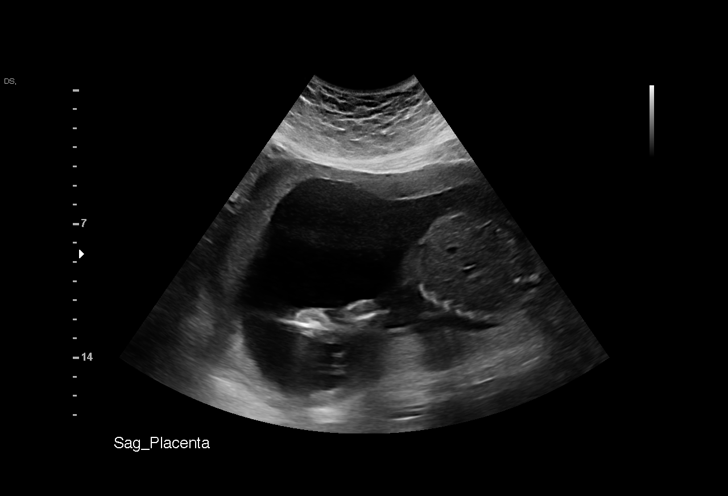
[im 5/45]
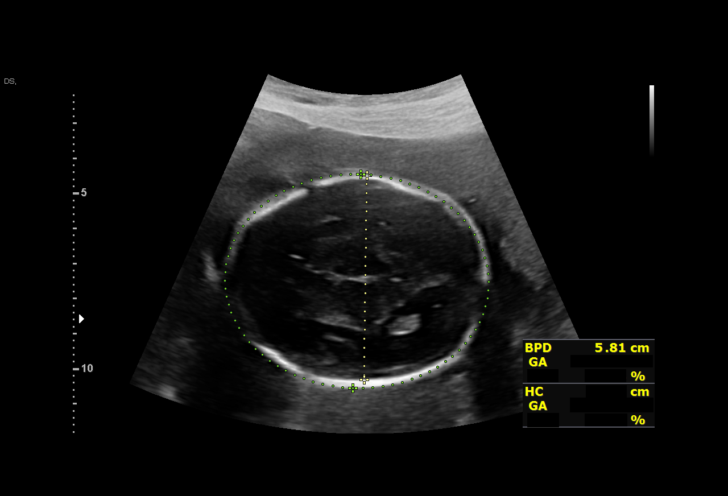
[im 9/45]
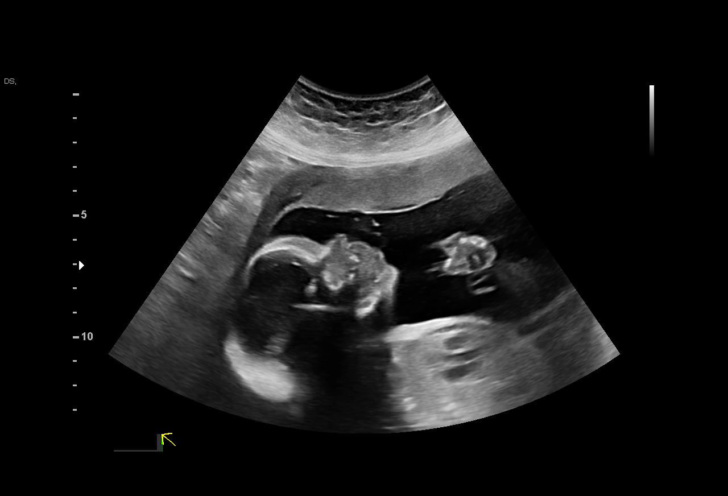
[im 12/45]
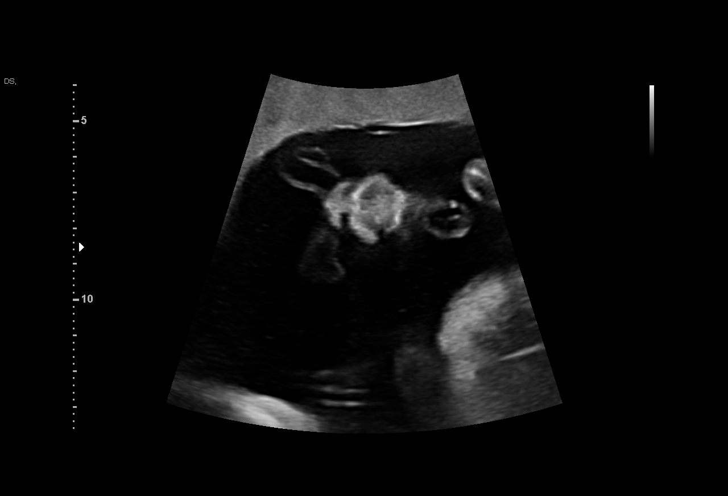
[im 15/45]
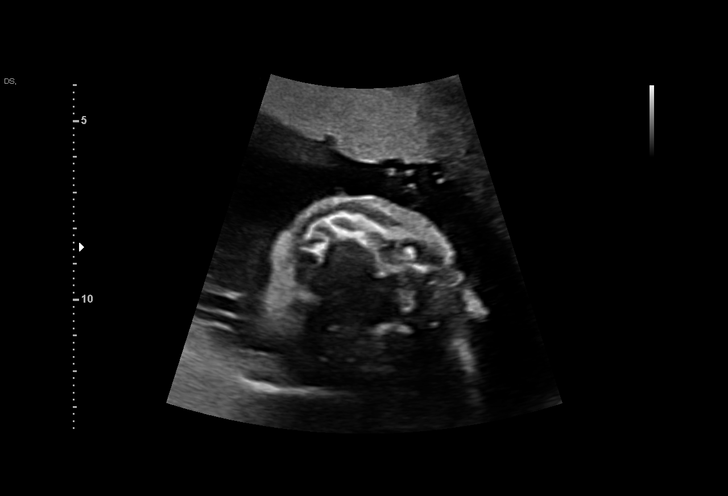
[im 18/45]
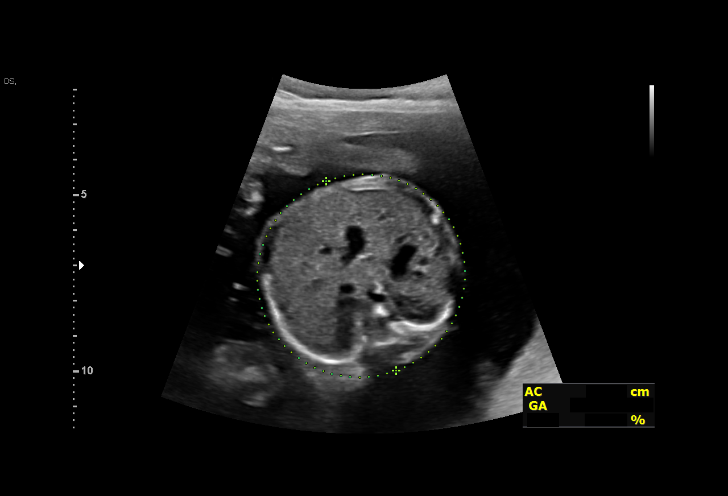
[im 22/45]
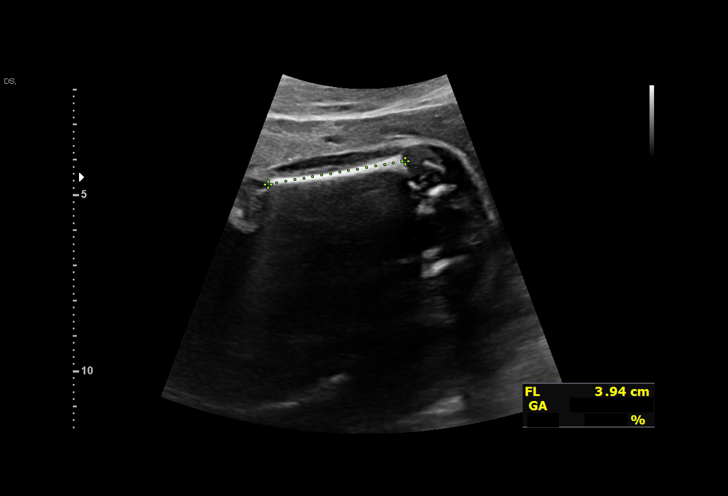
[im 25/45]
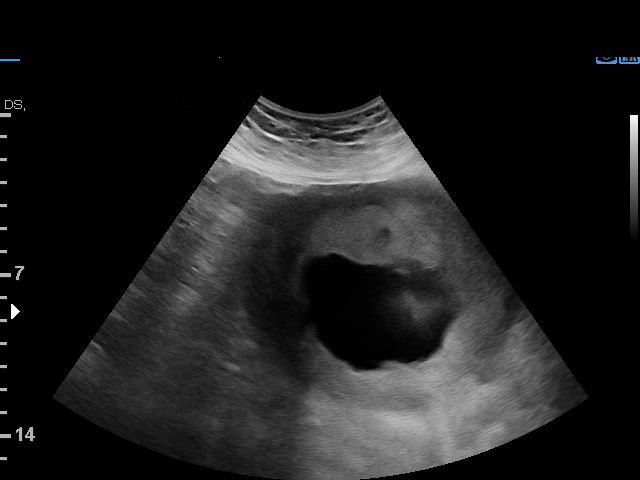
[im 28/45]
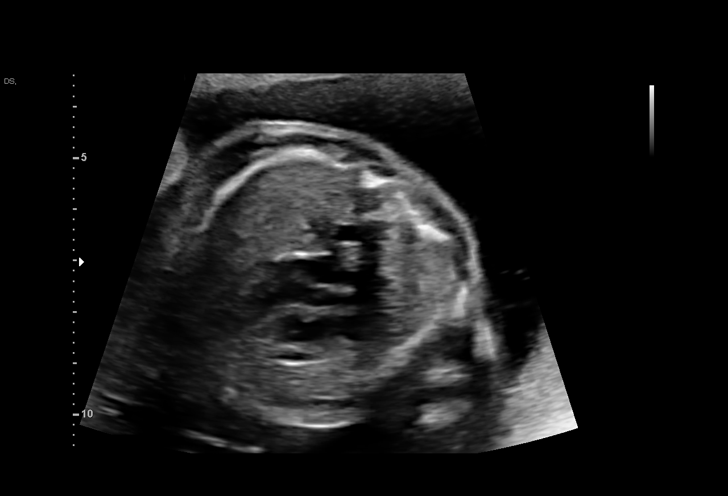
[im 31/45]
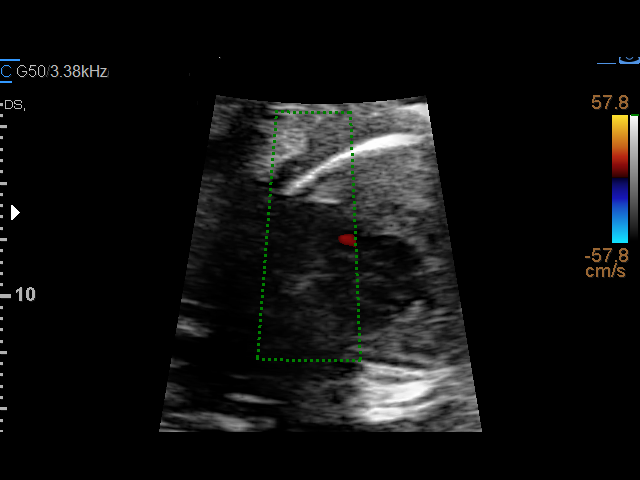
[im 35/45]
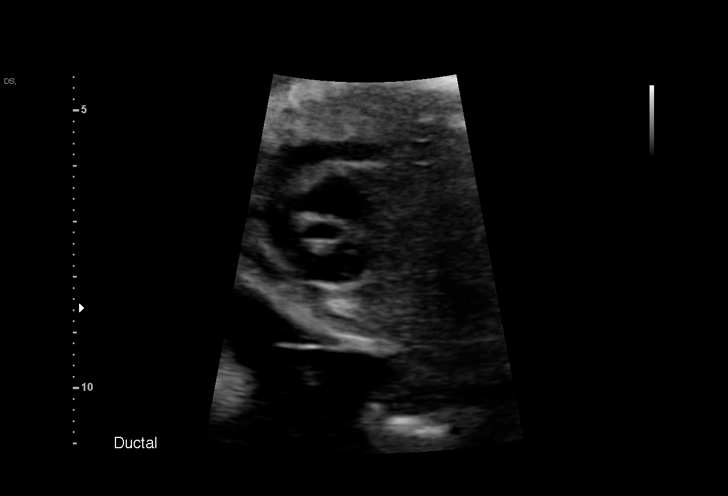
[im 38/45]
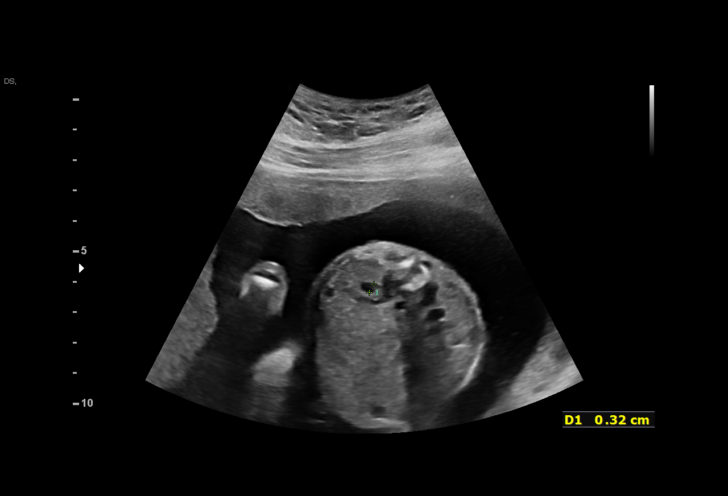
[im 41/45]
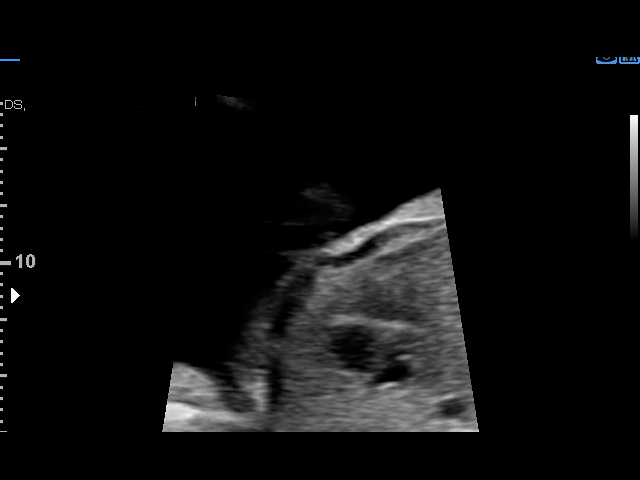
[im 45/45]
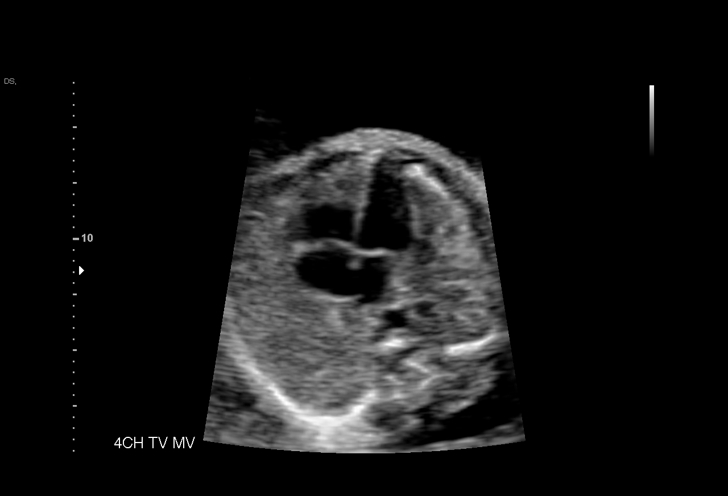

[14 of 28 positions shown; findings below may reference images not displayed]

Indications

 Obesity complicating pregnancy, second
 trimester (Pregravid BMI 39)
 Encounter for other antenatal screening
 follow-up
 Poor obstetrical history (Hx Covid)
 Genetic carrier (Kizia Jakosalem Silent carrier,
 Increased risk SMA)
 22 weeks gestation of pregnancy
 Low Risk NIPS(Negative AFP)
Vital Signs

                                                Height:        5'5"
Fetal Evaluation

 Num Of Fetuses:         1
 Cardiac Activity:       Observed
 Presentation:           Breech
 Placenta:               Anterior
 P. Cord Insertion:      Visualized, central

 Amniotic Fluid
 AFI FV:      Subjectively upper-normal

                             Largest Pocket(cm)

Biometry
 BPD:        58  mm     G. Age:  23w 5d         89  %    CI:        74.25   %    70 - 86
                                                         FL/HC:      18.5   %    18.4 -
 HC:      213.7  mm     G. Age:  23w 3d         77  %    HC/AC:      1.11        1.06 -
 AC:       193   mm     G. Age:  24w 0d         87  %    FL/BPD:     68.3   %    71 - 87
 FL:       39.6  mm     G. Age:  22w 5d         50  %    FL/AC:      20.5   %    20 - 24

 LV:        5.5  mm

 Est. FW:     598  gm      1 lb 5 oz     90  %
OB History

 Gravidity:    2         Term:   1        Prem:   0        SAB:   0
 TOP:          0       Ectopic:  0        Living: 1
Gestational Age

 LMP:           23w 0d        Date:  05/31/20                 EDD:   03/07/21
 U/S Today:     23w 3d                                        EDD:   03/04/21
 Best:          22w 3d     Det. By:  U/S C R L  (08/01/20)    EDD:   03/11/21
Anatomy

 Cranium:               Appears normal         LVOT:                   Appears normal
 Cavum:                 Appears normal         Aortic Arch:            Appears normal
 Ventricles:            Appears normal         Ductal Arch:            Appears normal
 Choroid Plexus:        Appears normal         Diaphragm:              Appears normal
 Cerebellum:            Appears normal         Stomach:                Appears normal, left
                                                                       sided
 Posterior Fossa:       Appears normal         Abdomen:                Previously seen
 Nuchal Fold:           Not applicable (>20    Abdominal Wall:         Appears nml (cord
                        wks GA)                                        insert, abd wall)
 Face:                  Appears normal         Cord Vessels:           Previously seen
                        (orbits and profile)
 Lips:                  Appears normal         Kidneys:                Appear normal
 Palate:                Appears normal         Bladder:                Appears normal
 Thoracic:              Appears normal         Spine:                  Previously seen
 Heart:                 Appears normal         Upper Extremities:      Previously seen
                        (4CH, axis, and
                        situs)
 RVOT:                  Appears normal         Lower Extremities:      Previously seen

 Other:  Male gender previously seen. Nasal bone visualized. VC, 3VV and
         3VTV visualized.
Cervix Uterus Adnexa

 Cervix
 Length:            4.2  cm.
 Normal appearance by transabdominal scan.
Impression

 Patient returned for completion of fetal anatomy .Fetal
 biometry is consistent with her previously-established dates
 .Amniotic fluid is normal and good fetal activity is seen .Fetal
 anatomical survey was completed (cardiac anatomy) and
 appears normal.

 Maternal obesity imposes limitations on the resolution of
 images, and failure to detect fetal anomalies is more common
 in obese pregnant women. As maternal obesity makes
 clinical assessment of fetal growth difficult, we recommend
 serial growth scans until delivery.
Recommendations

 -An appointment was made for her to return in 4 weeks for
 fetal growth assessment.
                 Imuet, Hou Kun

## 2021-03-13 ENCOUNTER — Ambulatory Visit (INDEPENDENT_AMBULATORY_CARE_PROVIDER_SITE_OTHER): Payer: BC Managed Care – PPO

## 2021-03-13 ENCOUNTER — Other Ambulatory Visit: Payer: Self-pay

## 2021-03-13 DIAGNOSIS — R600 Localized edema: Secondary | ICD-10-CM

## 2021-03-13 MED ORDER — HYDROCHLOROTHIAZIDE 25 MG PO TABS: 25.0000 mg | ORAL_TABLET | Freq: Every day | ORAL | 0 refills | Status: DC

## 2021-03-13 NOTE — Progress Notes (Signed)
Post Partum Visit Note  [name] is a 35 y.o. G31P1102 female who presents for a postpartum visit. She is 4 weeks postpartum following a normal spontaneous vaginal delivery.  I have fully reviewed the prenatal and intrapartum course. The delivery was at 36.6 gestational weeks.  Anesthesia: epidural. Postpartum course has been complicated by retained products w/o hemorrhage requiring surgical removal, LE swelling and hypertension. Baby is doing well. Baby is feeding by breast. Bleeding no bleeding. Bowel function is normal. Bladder function is normal. Patient is not sexually active. Contraception method is abstinence.  Postpartum depression screening: negative, score 1.  Patient receives help from husband and mother-in-law. She endorses safety at home.  She plans to return to work, but works from home.   The pregnancy intention screening data noted above was reviewed. Potential methods of contraception were discussed. The patient elected to proceed with No Method - Other Reason.    Edinburgh Postnatal Depression Scale - 03/13/21 0956      Edinburgh Postnatal Depression Scale:  In the Past 7 Days   I have been able to laugh and see the funny side of things. 0    I have looked forward with enjoyment to things. 0    I have blamed myself unnecessarily when things went wrong. 0    I have been anxious or worried for no good reason. 0    I have felt scared or panicky for no good reason. 0    Things have been getting on top of me. 1    I have been so unhappy that I have had difficulty sleeping. 0    I have felt sad or miserable. 0    I have been so unhappy that I have been crying. 0    The thought of harming myself has occurred to me. 0    Edinburgh Postnatal Depression Scale Total 1           Health Maintenance Due  Topic Date Due  . TETANUS/TDAP  Never done  . COVID-19 Vaccine (2 - Pfizer 3-dose series) 06/19/2020    The following portions of the patient's history were  reviewed and updated as appropriate: allergies, current medications, past family history, past medical history, past social history, past surgical history and problem list.  Review of Systems Pertinent items are noted in HPI.  Objective:  BP 118/82   Pulse 90   Wt 260 lb (117.9 kg)   BMI 43.27 kg/m    General:  alert, cooperative and no distress   Breasts:  normal  Lungs: clear to auscultation bilaterally  Heart:  regular rate and rhythm  Abdomen: normal findings: bowel sounds normal   Wound N/A  GU exam:  normal       Assessment:    4 week postpartum exam.  Normal Involution Lower Edema Swelling Pumping  Plan:   -Discussed edema of BLE. -Will do 7 day course of HCTZ.  Reviewed potential side effects including HA and dizziness. -Instructed to discontinue Norvasc. -Informed that it is okay to start light exercise after completion of HCTZ regimen.  -Reviewed desires for infant in next year or sooner. -Reviewed non-hormonal IUD if patient decides she does not want to conceive as soon as initially planned.  Essential components of care per ACOG recommendations:  1.  Mood and well being: Patient with negative depression screening today. Reviewed local resources for support.  - Patient tobacco use? No.   - hx of drug use? No.  2. Infant care and feeding:  -Patient currently breastmilk feeding? No. Patient is pumping and has no complaints/concerns with her breast.  -Social determinants of health (SDOH) reviewed in EPIC. No concerns.  3. Sexuality, contraception and birth spacing - Patient does not want a pregnancy in the next year.  Desired family size is 3-4 children.  - Reviewed forms of contraception in tiered fashion. Patient desired no method today.   - Discussed birth spacing of 18 months  4. Sleep and fatigue -Encouraged family/partner/community support of 4 hrs of uninterrupted sleep to help with mood and fatigue  5. Physical Recovery  - Discussed patients  delivery and complications. She describes her labor experience was wonderful.  Speaks highly of L&D staff.  Reports feeling she was not well informed during her PP stay! However, experience was good. - Patient had a Vaginal problems after delivery including retained placenta with surgical removal. Patient had a no lacerations. Perineal healing reviewed. Patient expressed understanding - Patient has urinary incontinence? No. - Patient is safe to resume physical and sexual activity  6.  Health Maintenance - HM due items addressed No - N/A - Last pap smear  Diagnosis  Date Value Ref Range Status  08/08/2020   Final   - Negative for intraepithelial lesion or malignancy (NILM)   Pap smear done at today's visit.  -Breast Cancer screening indicated? No.   7. Chronic Disease/Pregnancy Condition follow up: None -BP stable today without Norvasc dosing.  - PCP follow up  Cherre Robins, CNM [facility]

## 2021-04-10 NOTE — Addendum Note (Signed)
Encounter addended by: Richelle Ito, RN on: 04/10/2021 8:26 PM  Actions taken: Letter saved

## 2021-06-24 DIAGNOSIS — N6121 Granulomatous mastitis, right breast: Secondary | ICD-10-CM | POA: Diagnosis not present

## 2021-06-24 DIAGNOSIS — E6609 Other obesity due to excess calories: Secondary | ICD-10-CM | POA: Diagnosis not present

## 2021-07-15 DIAGNOSIS — F4311 Post-traumatic stress disorder, acute: Secondary | ICD-10-CM | POA: Diagnosis not present

## 2021-07-15 DIAGNOSIS — N6121 Granulomatous mastitis, right breast: Secondary | ICD-10-CM | POA: Diagnosis not present

## 2021-08-21 DIAGNOSIS — F411 Generalized anxiety disorder: Secondary | ICD-10-CM | POA: Diagnosis not present

## 2021-08-21 DIAGNOSIS — F331 Major depressive disorder, recurrent, moderate: Secondary | ICD-10-CM | POA: Diagnosis not present

## 2021-09-15 DIAGNOSIS — F331 Major depressive disorder, recurrent, moderate: Secondary | ICD-10-CM | POA: Diagnosis not present

## 2021-09-15 DIAGNOSIS — F411 Generalized anxiety disorder: Secondary | ICD-10-CM | POA: Diagnosis not present

## 2021-09-15 DIAGNOSIS — F4312 Post-traumatic stress disorder, chronic: Secondary | ICD-10-CM | POA: Diagnosis not present

## 2021-10-26 NOTE — L&D Delivery Note (Signed)
Delivery Note  36 y.o. [id number] at [redacted]w[redacted]d who arrived the MAU, noted to be fully dilated and feeling pressure, transferred quickly to Labor and Delivery and proceeded to have an uncomplicated precipitous delivery.  At 12:23 AM a viable female was delivered via Vaginal, Spontaneous (Presentation:   Occiput Anterior).  APGAR: 9, 9; weight  TBD.   Placenta status: Spontaneous, Intact.  Cord: 3 vessels with the following complications: None.  Cord pH: not collected  Baby required some resuscitation including stimulation and blow by O2 due to precipitous nature of delivery.  Anesthesia: None Episiotomy: None Lacerations: None Suture Repair:  n/a Est. Blood Loss (mL): 36  Mom to postpartum.  Baby to Couplet care / Skin to Skin.  Liliane Channel MD MPH OB Fellow, [place] for [place] 08/21/2022

## 2021-11-04 ENCOUNTER — Other Ambulatory Visit: Payer: Self-pay

## 2021-11-04 ENCOUNTER — Inpatient Hospital Stay (HOSPITAL_COMMUNITY)
Admission: AD | Admit: 2021-11-04 | Discharge: 2021-11-04 | Disposition: A | Discharge status: Home / Self Care | Payer: BC Managed Care – PPO | Attending: Obstetrics & Gynecology | Admitting: Obstetrics & Gynecology

## 2021-11-04 DIAGNOSIS — N939 Abnormal uterine and vaginal bleeding, unspecified: Secondary | ICD-10-CM | POA: Diagnosis not present

## 2021-11-04 DIAGNOSIS — Z3202 Encounter for pregnancy test, result negative: Secondary | ICD-10-CM | POA: Insufficient documentation

## 2021-11-04 LAB — HEMOGLOBIN AND HEMATOCRIT, BLOOD
HCT: 40.6 % (ref 36.0–46.0)
Hemoglobin: 13.6 g/dL (ref 12.0–15.0)

## 2021-11-04 LAB — HCG, QUANTITATIVE, PREGNANCY: hCG, Beta Chain, Quant, S: <1 m[IU]/mL (ref <5)

## 2021-11-04 LAB — POCT PREGNANCY, URINE: Preg Test, Ur: NEGATIVE

## 2021-11-04 NOTE — MAU Provider Note (Signed)
Event Date/Time   First Provider Initiated Contact with Patient 11/04/21 1736      S Ms. [name] is a 36 y.o. [id number] patient who presents to MAU today with complaint of vaginal bleeding. Reports positive urine pregnancy test yesterday. Vaginal bleeding started last night & increased today. States bleeding is heavier than a period but not saturating pads.    O BP (!) 141/79 (BP Location: Right Arm)    Pulse 92    Temp 98.5 F (36.9 C) (Oral)    Resp 19    Ht 5\' 5"  (1.651 m)    Wt 123.2 kg    LMP 10/09/2021    SpO2 98%    BMI 45.21 kg/m  Physical Exam Vitals and nursing note reviewed.  Constitutional:      General: She is not in acute distress.    Appearance: She is well-developed.  HENT:     Head: Normocephalic and atraumatic.  Eyes:     General: No scleral icterus.    Extraocular Movements: Extraocular movements intact.  Pulmonary:     Effort: Pulmonary effort is normal. No respiratory distress.  Neurological:     General: No focal deficit present.     Mental Status: She is alert.  Psychiatric:        Mood and Affect: Mood normal.        Behavior: Behavior normal.    A Medical screening exam complete 1. Negative pregnancy test     P Discharge home F/u with [facility] prn  10/11/2021, NP 11/04/2021 9:00 PM

## 2021-11-04 NOTE — MAU Note (Signed)
Presents with c/o VB and abdominal pain/cramping.  States symptoms began yesterday morning.  Reports VB was initially light but got heavier throughout the night and this morning, states had to wear a sanitary napkin.  LMP 10/09/2021.  +HPT yesterday morning.

## 2022-02-06 ENCOUNTER — Ambulatory Visit (INDEPENDENT_AMBULATORY_CARE_PROVIDER_SITE_OTHER): Payer: BC Managed Care – PPO | Admitting: Emergency Medicine

## 2022-02-06 ENCOUNTER — Encounter: Payer: Self-pay | Admitting: Emergency Medicine

## 2022-02-06 DIAGNOSIS — N912 Amenorrhea, unspecified: Secondary | ICD-10-CM

## 2022-02-06 DIAGNOSIS — Z3200 Encounter for pregnancy test, result unknown: Secondary | ICD-10-CM

## 2022-02-06 LAB — POCT URINE PREGNANCY: Preg Test, Ur: POSITIVE — AB

## 2022-02-06 NOTE — Progress Notes (Signed)
Patient was assessed and managed by nursing staff during this encounter. I have reviewed the chart and agree with the documentation and plan. I have also made any necessary editorial changes. ? ?Darlen Gledhill A Aftin Lye, MD ?02/06/2022 6:10 PM   ?

## 2022-02-06 NOTE — Progress Notes (Signed)
Ms. Lineman presents today for UPT. She has no unusual complaints. ?LMP: 11/30/2020 ?   ?OBJECTIVE: Appears well, in no apparent distress.  ?OB History   ? ? Gravida  ?3  ? Para  ?2  ? Term  ?1  ? Preterm  ?1  ? AB  ?0  ? Living  ?2  ?  ? ? SAB  ?0  ? IAB  ?0  ? Ectopic  ?0  ? Multiple  ?0  ? Live Births  ?2  ?   ?  ?  ? ?Home UPT Result: Positive ?In-Office UPT result:Positive ?I have reviewed the patient's medical, obstetrical, social, and family histories, and medications.  ? ?ASSESSMENT: Positive pregnancy test ? ?PLAN ?Prenatal care to be completed at: [facility] ?  ?

## 2022-02-11 ENCOUNTER — Ambulatory Visit (INDEPENDENT_AMBULATORY_CARE_PROVIDER_SITE_OTHER): Payer: BC Managed Care – PPO

## 2022-02-11 VITALS — BP 128/83 | HR 102 | Ht 65.0 in | Wt 268.8 lb

## 2022-02-11 DIAGNOSIS — Z3491 Encounter for supervision of normal pregnancy, unspecified, first trimester: Secondary | ICD-10-CM | POA: Diagnosis not present

## 2022-02-11 DIAGNOSIS — O3680X0 Pregnancy with inconclusive fetal viability, not applicable or unspecified: Secondary | ICD-10-CM | POA: Diagnosis not present

## 2022-02-11 DIAGNOSIS — O0990 Supervision of high risk pregnancy, unspecified, unspecified trimester: Secondary | ICD-10-CM | POA: Insufficient documentation

## 2022-02-11 DIAGNOSIS — Z3481 Encounter for supervision of other normal pregnancy, first trimester: Secondary | ICD-10-CM | POA: Diagnosis not present

## 2022-02-11 DIAGNOSIS — Z3A11 11 weeks gestation of pregnancy: Secondary | ICD-10-CM

## 2022-02-11 NOTE — Progress Notes (Signed)
Agree with nurses's documentation of this patient's clinic encounter.  Patriciann Becht L, MD  

## 2022-02-11 NOTE — Progress Notes (Cosign Needed)
New OB Intake ? ?I connected with  [name] on 02/11/22 at  3:10 PM EDT by in person and verified that I am speaking with the correct person using two identifiers. Nurse is located at [facility] and pt is located at [place]. ? ?I discussed the limitations, risks, security and privacy concerns of performing an evaluation and management service by telephone and the availability of in person appointments. I also discussed with the patient that there may be a patient responsible charge related to this service. The patient expressed understanding and agreed to proceed. ? ?I explained I am completing New OB Intake today. We discussed her EDD of 08/31/22 that is based on early u/s. Pt is G3/P1102. I reviewed her allergies, medications, Medical/Surgical/OB history, and appropriate screenings. I informed her of [facility] services. Based on history, this is a/an  pregnancy uncomplicated .  ? ?Patient Active Problem List  ? Diagnosis Date Noted  ? 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 Vaginal delivery 02/13/2021  ? Retained placenta w/o hemorrhage, delivered, current hospitalization 02/13/2021  ? Alpha thalassemia silent carrier 10/11/2020  ? ? ?Concerns addressed today ? ?Delivery Plans:  ?Plans to deliver at [facility].  ? ?MyChart/Babyscripts ?MyChart access verified. I explained pt will have some visits in office and some virtually. Babyscripts instructions given and order placed. Patient verifies receipt of registration text/e-mail. Account successfully created and app downloaded. ? ?Blood Pressure Cuff  ?Patient has BP cuff from last pregnancy. Explained after first prenatal appt pt will check weekly and document in Babyscripts. ? ?Weight scale: Patient does have weight scale. ? ?Anatomy US ?Explained first scheduled [place] will be around 19 weeks. Dating and viability scan performed today. ? ?Labs ?Discussed Avelina Laine genetic screening with patient. Would like both Panorama and Horizon drawn at new OB visit.Also if interested in  genetic testing, tell patient she will need AFP 15-21 weeks to complete genetic testing .Routine prenatal labs needed. ? ?Covid Vaccine ?Patient has covid vaccine.  ?  ?Is patient interested in [place]? Yes  "Interested in WB - Schedule next visit with CNM" on sticky note ? ?Informed patient of [facility] website  and placed link in her AVS.  ? ?Social Determinants of Health ?Food Insecurity: Patient denies food insecurity. ?WIC Referral: Patient is not interested in referral to [facility].  ?Transportation: Patient denies transportation needs. ?Childcare: Discussed no children allowed at ultrasound appointments. Offered childcare services; patient declines childcare services at this time. ? ?Send link to Pregnancy Navigators ? ? ?Placed OB Box on problem list and updated ? ?First visit review ?I reviewed new OB appt with pt. I explained she will have a pelvic exam, ob bloodwork with genetic screening, and PAP smear. Explained pt will be seen by Sharen Counter at first visit; encounter routed to appropriate provider. Explained that patient will be seen by pregnancy navigator following visit with provider. [facility] information placed in AVS.  ? ?Hamilton Capri, RN ?02/11/2022  3:10 PM  ?

## 2022-03-09 ENCOUNTER — Encounter: Payer: Self-pay | Admitting: Advanced Practice Midwife

## 2022-03-09 ENCOUNTER — Other Ambulatory Visit (HOSPITAL_COMMUNITY)
Admission: RE | Admit: 2022-03-09 | Discharge: 2022-03-09 | Disposition: A | Discharge status: Home / Self Care | Payer: BC Managed Care – PPO | Source: Ambulatory Visit | Attending: Advanced Practice Midwife | Admitting: Advanced Practice Midwife

## 2022-03-09 ENCOUNTER — Ambulatory Visit (INDEPENDENT_AMBULATORY_CARE_PROVIDER_SITE_OTHER): Payer: BC Managed Care – PPO | Admitting: Advanced Practice Midwife

## 2022-03-09 VITALS — BP 125/81 | HR 91 | Wt 268.2 lb

## 2022-03-09 DIAGNOSIS — Z3A15 15 weeks gestation of pregnancy: Secondary | ICD-10-CM

## 2022-03-09 DIAGNOSIS — Z3481 Encounter for supervision of other normal pregnancy, first trimester: Secondary | ICD-10-CM | POA: Diagnosis present

## 2022-03-09 DIAGNOSIS — R0981 Nasal congestion: Secondary | ICD-10-CM

## 2022-03-09 DIAGNOSIS — O99891 Other specified diseases and conditions complicating pregnancy: Secondary | ICD-10-CM

## 2022-03-09 NOTE — Progress Notes (Signed)
?  ? ?Subjective:  ? ?[name] is a 36 y.o. G3P1102 at 74w0dby LMP being seen today for her first obstetrical visit.  Her obstetrical history is significant for  preterm delivery x 1, SVD x 2  and has Alpha thalassemia silent carrier; Genetic carrier; Vaginal delivery; Retained placenta w/o hemorrhage, delivered, current hospitalization; and Supervision of normal pregnancy in first trimester on their problem list.. Patient does intend to breast feed. Pregnancy history fully reviewed. ? ?Patient reports  nasal congestion . ? ?HISTORY: ?OB History  ?Gravida Para Term Preterm AB Living  ?'3 2 1 1 ' 0 2  ?SAB IAB Ectopic Multiple Live Births  ?0 0 0 0 2  ?  ?# Outcome Date GA Lbr Len/2nd Weight Sex Delivery Anes PTL Lv  ?3 Current           ?2 Preterm 02/13/21 [id number]:44 / 00:35 6 lb 11.1 oz (3.035 kg) M Vag-Spont EPI  LIV  ?   Name: MCFRENCHIE, PRIBYL?   Apgar1: 9  Apgar5: 9  ?1 Term 05/03/07   5 lb (2.268 kg) F Vag-Spont   LIV  ? ?Past Medical History:  ?Diagnosis Date  ? Gestational hypertension 02/15/2021  ? Migraine   ? ?Past Surgical History:  ?Procedure Laterality Date  ? DILATION AND EVACUATION N/A 02/13/2021  ? Procedure: DILATATION AND EVACUATION;  Surgeon: PrDonnamae JudeMD;  Location: [facility] LD ORS;  Service: Gynecology;  Laterality: N/A;  ? WISDOM TOOTH EXTRACTION    ? ?Family History  ?Problem Relation Age of Onset  ? Breast cancer Mother   ? Heart disease Father   ? Breast cancer Maternal Grandmother   ? ?Social History  ? ?Tobacco Use  ? Smoking status: Never  ? Smokeless tobacco: Never  ?Vaping Use  ? Vaping Use: Never used  ?Substance Use Topics  ? Alcohol use: Not Currently  ?  Comment: Last drink early Feb 2023  ? Drug use: No  ? ?No Known Allergies ?Current Outpatient Medications on File Prior to Visit  ?Medication Sig Dispense Refill  ? acetaminophen (TYLENOL) 500 MG tablet Take 2 tablets (1,000 mg total) by mouth every 6 (six) hours as needed (for pain scale < 4). 30 tablet   ? Blood  Pressure Monitoring (BLOOD PRESSURE KIT) KIT 1 kit by Does not apply route once a week. 1 kit 0  ? Prenatal MV-Min-FA-Omega-3 (PRENATAL GUMMIES/DHA & FA) 0.4-32.5 MG CHEW Chew by mouth.    ? ?No current facility-administered medications on file prior to visit.  ? ? ? Indications for ASA therapy (per uptodate) ?One of the following: ?Previous pregnancy with preeclampsia, especially early onset and with an adverse outcome No ?Multifetal gestation No ?Chronic hypertension No ?Type 1 or 2 diabetes mellitus No ?Chronic kidney disease No ?Autoimmune disease (antiphospholipid syndrome, systemic lupus erythematosus) No ? ? Two or more of the following: ?Nulliparity No ?Obesity (body mass index >30 kg/m2) Yes ?Family history of preeclampsia in mother or sister No ?Age ?35 years No ?Sociodemographic characteristics (African American race, low socioeconomic level) Yes ?Personal risk factors (eg, previous pregnancy with low birth weight or small for gestational age infant, previous adverse pregnancy outcome [eg, stillbirth], interval >10 years between pregnancies) No ? ? Indications for early 1 hour GTT (per uptodate) ? ?BMI >25 (>23 in Asian women) AND one of the following ? ?Gestational diabetes mellitus in a previous pregnancy No ?Glycated hemoglobin ?5.7 percent (39 mmol/mol), impaired glucose tolerance, or impaired fasting glucose on  previous testing No ?First-degree relative with diabetes No ?High-risk race/ethnicity (eg, African American, Latino, Native American, Asian American, [place]) Yes ?History of cardiovascular disease No ?Hypertension or on therapy for hypertension No ?High-density lipoprotein cholesterol level <35 mg/dL (0.90 mmol/L) and/or a triglyceride level >250 mg/dL (2.82 mmol/L) No ?Polycystic ovary syndrome No ?Physical inactivity No ?Other clinical condition associated with insulin resistance (eg, severe obesity, acanthosis nigricans) No ?Previous birth of an infant weighing ?4000 g  No ?Previous stillbirth of unknown cause No ?Exam  ? ?Vitals:  ? 03/09/22 1603  ?BP: 125/81  ?Pulse: 91  ?Weight: 268 lb 3.2 oz (121.7 kg)  ? ?  ? ?Uterus:  Fundal Height: 155 cm  ?Pelvic Exam: Perineum: no hemorrhoids, normal perineum  ? Vulva: normal external genitalia, no lesions  ? Vagina:  normal mucosa, normal discharge  ? Cervix: no lesions and normal, pap smear done.   ? Adnexa: normal adnexa and no mass, fullness, tenderness  ? Bony Pelvis: average  ?System: General: well-developed, well-nourished female in no acute distress  ? Breast:  normal appearance, no masses or tenderness  ? Skin: normal coloration and turgor, no rashes  ? Neurologic: oriented, normal, negative, normal mood  ? Extremities: normal strength, tone, and muscle mass, ROM of all joints is normal  ? HEENT PERRLA, extraocular movement intact and sclera clear, anicteric  ? Mouth/Teeth mucous membranes moist, pharynx normal without lesions and dental hygiene good  ? Neck supple and no masses  ? Cardiovascular: regular rate and rhythm  ? Respiratory:  no respiratory distress, normal breath sounds  ? Abdomen: soft, non-tender; bowel sounds normal; no masses,  no organomegaly  ? ?  ?Assessment:  ? ?Pregnancy: [id number] ?Patient Active Problem List  ? Diagnosis Date Noted  ? Supervision of normal pregnancy in first trimester 02/11/2022  ? 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 Vaginal delivery 02/13/2021  ? Retained placenta w/o hemorrhage, delivered, current hospitalization 02/13/2021  ? Alpha thalassemia silent carrier 10/11/2020  ? ?  ?Plan:  ?1. Encounter for supervision of other normal pregnancy in first trimester ?--Anticipatory guidance about next visits/weeks of pregnancy given.  ? ?- Cytology - PAP( [facility]) ?- Genetic Screening ?- Cervicovaginal ancillary only( [facility]) ?- HIV antibody (with reflex) ?- RPR ?- Hepatitis B Surface AntiGEN ?- Hepatitis C Antibody ?- Culture, OB Urine ? ?2. [redacted] weeks gestation of pregnancy ? ? ?3. Retained  placenta w/o hemorrhage, delivered, current hospitalization ?--Pt with retained placenta after last delivery, no hemorrhage with EBL ~ 250 ml only.   ? ? ?Initial labs drawn. ?Continue prenatal vitamins. ?Discussed and offered genetic screening options, including Quad screen/AFP, NIPS testing, and option to decline testing. Benefits/risks/alternatives reviewed. Pt aware that anatomy US is form of genetic screening with lower accuracy in detecting trisomies than blood work.  Pt chooses genetic screening today. NIPS: requested. Had Horizon with previous pregnancy. ?Ultrasound discussed; fetal anatomic survey: requested. ?Problem list reviewed and updated. ?The nature of [place] with multiple MDs and other Advanced Practice Providers was explained to patient; also emphasized that residents, students are part of our team. ?Routine obstetric precautions reviewed. ?No follow-ups on file. ? ? ?Fatima Blank, CNM ?03/09/22 ?4:41 PM  ?

## 2022-03-09 NOTE — Patient Instructions (Signed)

## 2022-03-10 LAB — HEPATITIS B SURFACE ANTIGEN: Hepatitis B Surface Ag: NEGATIVE

## 2022-03-10 LAB — HIV ANTIBODY (ROUTINE TESTING W REFLEX): HIV Screen 4th Generation wRfx: NONREACTIVE

## 2022-03-10 LAB — RPR: RPR Ser Ql: NONREACTIVE

## 2022-03-10 LAB — HEPATITIS C ANTIBODY: Hep C Virus Ab: NONREACTIVE

## 2022-03-11 LAB — CERVICOVAGINAL ANCILLARY ONLY
Chlamydia: NEGATIVE
Comment: NEGATIVE
Comment: NORMAL
Neisseria Gonorrhea: NEGATIVE

## 2022-03-11 LAB — CULTURE, OB URINE

## 2022-03-11 LAB — URINE CULTURE, OB REFLEX

## 2022-03-16 ENCOUNTER — Encounter: Payer: Self-pay | Admitting: Advanced Practice Midwife

## 2022-03-16 DIAGNOSIS — Z3481 Encounter for supervision of other normal pregnancy, first trimester: Secondary | ICD-10-CM

## 2022-04-06 ENCOUNTER — Encounter: Payer: Self-pay | Admitting: Advanced Practice Midwife

## 2022-04-06 ENCOUNTER — Ambulatory Visit (INDEPENDENT_AMBULATORY_CARE_PROVIDER_SITE_OTHER): Payer: BC Managed Care – PPO | Admitting: Advanced Practice Midwife

## 2022-04-06 VITALS — BP 126/76 | HR 98 | Wt 267.0 lb

## 2022-04-06 DIAGNOSIS — O165 Unspecified maternal hypertension, complicating the puerperium: Secondary | ICD-10-CM

## 2022-04-06 DIAGNOSIS — O99210 Obesity complicating pregnancy, unspecified trimester: Secondary | ICD-10-CM

## 2022-04-06 DIAGNOSIS — Z3481 Encounter for supervision of other normal pregnancy, first trimester: Secondary | ICD-10-CM

## 2022-04-06 DIAGNOSIS — Z8751 Personal history of pre-term labor: Secondary | ICD-10-CM

## 2022-04-06 NOTE — Progress Notes (Signed)
Presents for ROB. Needs anatomy scan scheduled.  FHT 160

## 2022-04-06 NOTE — Progress Notes (Signed)
PRENATAL VISIT NOTE  Subjective:  [name] is a 36 y.o. G3P1102 at [redacted]w[redacted]d being seen today for ongoing prenatal care.  She is currently monitored for the following issues for this high-risk pregnancy and has Alpha thalassemia silent carrier; Genetic carrier; Vaginal delivery; Retained placenta w/o hemorrhage, delivered, current hospitalization; and Supervision of normal pregnancy in first trimester on their problem list.  Patient reports no complaints.  Contractions: Not present.  .  Movement: Present. Denies leaking of fluid.   The following portions of the patient's history were reviewed and updated as appropriate: allergies, current medications, past family history, past medical history, past social history, past surgical history and problem list.   Objective:   Vitals:   04/06/22 1607  BP: 126/76  Pulse: 98  Weight: 267 lb (121.1 kg)    Fetal Status: Fetal Heart Rate (bpm): 160 Fundal Height: 19 cm Movement: Present     General:  Alert, oriented and cooperative. Patient is in no acute distress.  Skin: Skin is warm and dry. No rash noted.   Cardiovascular: Normal heart rate noted  Respiratory: Normal respiratory effort, no problems with respiration noted  Abdomen: Soft, gravid, appropriate for gestational age.  Pain/Pressure: Absent     Pelvic: Cervical exam deferred        Extremities: Normal range of motion.  Edema: None  Mental Status: Normal mood and affect. Normal behavior. Normal judgment and thought content.   Assessment and Plan:  Pregnancy: G3P1102 at [redacted]w[redacted]d 1. Encounter for supervision of other normal pregnancy in first trimester --HROB for hx PTL at [redacted]w[redacted]d and BMI 44 --No other risk factors,  no mobility issues with BMI so no absolute contraindications to WB at this time - Pt interested in waterbirth and has attended the class with previous pregnancy. - Reviewed conditions in labor that will risk her out of water immersion including thick meconium or blood  stained amniotic fluid, non-reassuring fetal status on monitor, excessive bleeding, hypertension, dizziness, use of IV meds, damaged equipment or staffing that does not allow for water immersion, etc.  - The attending midwife must be on the unit for water immersion to begin; pt understands this may delay the start of water immersion. - Reminded pt that signing consent in labor at the hospital also acknowledges they will exit the tub if the attending midwife requests. - Consent given to patient for review.  Consent will be reviewed and signed at the hospital by the waterbirth provider prior to use of the tub. - Discussed other labor support options if waterbirth becomes unavailable, including position change, freedom of movement, use of birthing ball, and/or use of hydrotherapy in the shower (dependent upon medical condition/provider discretion).    - [place] MFM OB DETAIL +14 WK; Future  2. Retained placenta w/o hemorrhage, delivered, current hospitalization --No retained placenta with first pregnancy, then retained with removal in OR with second delivery --Since placenta is delivered out of the tub, this is not an absolute contraindication to waterbirth but discussed provider discretion with patient today and team on labor and delivery upon arrival will review and decide about safety in the water. Pt states understanding.  3. History of preterm labor --[redacted]w[redacted]d normal vaginal delivery  4. Postpartum hypertension --No hx HTN prior to pregnancy, some elevated BP at hospital after last delivery only.  --BASA daily, Rx sent today   Preterm labor symptoms and general obstetric precautions including but not limited to vaginal bleeding, contractions, leaking of fluid and fetal movement were reviewed  in detail with the patient. Please refer to After Visit Summary for other counseling recommendations.   Return in about 4 weeks (around 05/04/2022) for LOB, Midwife preferred, schedule MFM detail [place] as soon as  possible.  Future Appointments  Date Time Provider [place]  05/04/2022  3:50 PM Elvera Maria, CNM [facility] None  05/06/2022  8:45 AM [facility] NURSE [facility]  05/06/2022  9:00 AM [facility] US1 [facility] [place]    Fatima Blank, CNM

## 2022-04-17 ENCOUNTER — Ambulatory Visit: Payer: BC Managed Care – PPO

## 2022-04-17 ENCOUNTER — Other Ambulatory Visit: Payer: BC Managed Care – PPO

## 2022-04-17 DIAGNOSIS — Z3A20 20 weeks gestation of pregnancy: Secondary | ICD-10-CM

## 2022-04-19 LAB — AFP, SERUM, OPEN SPINA BIFIDA
AFP MoM: 1.34
AFP Value: 64.6 ng/mL
Gest. Age on Collection Date: 20.4 weeks
Maternal Age At EDD: 34.3 yr
OSBR Risk 1 IN: 8546
Test Results:: NEGATIVE
Weight: 267 [lb_av]

## 2022-04-19 LAB — HEMOGLOBIN A1C
Est. average glucose Bld gHb Est-mCnc: 108 mg/dL
Hgb A1c MFr Bld: 5.4 % (ref 4.8–5.6)

## 2022-04-21 ENCOUNTER — Ambulatory Visit: Payer: BC Managed Care – PPO

## 2022-04-21 ENCOUNTER — Other Ambulatory Visit: Payer: BC Managed Care – PPO

## 2022-05-04 ENCOUNTER — Encounter: Payer: BC Managed Care – PPO | Admitting: Advanced Practice Midwife

## 2022-05-06 ENCOUNTER — Ambulatory Visit: Payer: BC Managed Care – PPO | Attending: Advanced Practice Midwife | Admitting: Maternal & Fetal Medicine

## 2022-05-06 ENCOUNTER — Other Ambulatory Visit: Payer: Self-pay | Admitting: Advanced Practice Midwife

## 2022-05-06 ENCOUNTER — Other Ambulatory Visit: Payer: Self-pay | Admitting: *Deleted

## 2022-05-06 ENCOUNTER — Ambulatory Visit: Payer: BC Managed Care – PPO | Attending: Advanced Practice Midwife

## 2022-05-06 ENCOUNTER — Ambulatory Visit: Payer: BC Managed Care – PPO | Admitting: *Deleted

## 2022-05-06 ENCOUNTER — Encounter: Payer: Self-pay | Admitting: *Deleted

## 2022-05-06 VITALS — BP 132/71 | HR 100

## 2022-05-06 DIAGNOSIS — E669 Obesity, unspecified: Secondary | ICD-10-CM | POA: Diagnosis not present

## 2022-05-06 DIAGNOSIS — O4402 Placenta previa specified as without hemorrhage, second trimester: Secondary | ICD-10-CM

## 2022-05-06 DIAGNOSIS — O09212 Supervision of pregnancy with history of pre-term labor, second trimester: Secondary | ICD-10-CM | POA: Diagnosis not present

## 2022-05-06 DIAGNOSIS — O99212 Obesity complicating pregnancy, second trimester: Secondary | ICD-10-CM | POA: Diagnosis not present

## 2022-05-06 DIAGNOSIS — D563 Thalassemia minor: Secondary | ICD-10-CM | POA: Diagnosis not present

## 2022-05-06 DIAGNOSIS — O09292 Supervision of pregnancy with other poor reproductive or obstetric history, second trimester: Secondary | ICD-10-CM | POA: Insufficient documentation

## 2022-05-06 DIAGNOSIS — Z3481 Encounter for supervision of other normal pregnancy, first trimester: Secondary | ICD-10-CM

## 2022-05-06 DIAGNOSIS — O43122 Velamentous insertion of umbilical cord, second trimester: Secondary | ICD-10-CM

## 2022-05-06 DIAGNOSIS — Z3A23 23 weeks gestation of pregnancy: Secondary | ICD-10-CM | POA: Insufficient documentation

## 2022-05-06 DIAGNOSIS — Z363 Encounter for antenatal screening for malformations: Secondary | ICD-10-CM | POA: Diagnosis present

## 2022-05-06 DIAGNOSIS — O4392 Unspecified placental disorder, second trimester: Secondary | ICD-10-CM

## 2022-05-06 NOTE — Progress Notes (Signed)
MFM Brief Note  [name] is a 36 yo G3P2 who is seen today at 82 w 2d with an EDD of 08/31/22 at the request of Sharen Counter, CNM  She has not complaints has a known LR NIPT and AFP. 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 (her husband tested neg per provider notes).  [place] is a here for a detailed examination  There was normal anatomy with measurements consistent with dates. We observed good fetal movement and amniotic fluid volume. Suboptimal views of the fetal anatomy was limited due to fetal position and maternal habitus.   A possible placenta previa was observed. The assessment was limited due to a lower uterine segment contraction. However, there appears to be an assessory lobe with the umbilical cord insertion point between the primary placenta and the accessory lobe. There was approximately 1 cm space of membrane between the two placentas. This area was to the right of the internal os the exact distance appeared close but an exact distance could not be determined.   I reviewed these images with Ms Ganesh.   I discussed the following regarding a placenta previa We discussed the fact that placenta previa is associated with an increased risk of bleeding, inpatient stay, emergency cesarean delivery, preterm birth, and bleeding precautions were reviewed. We advised pelvic rest. Should this condition fail to resolve, we discussed the necessity of cesarean delivery.    Today's sonogram demonstrates no evidence of invasive placentation.   Secondly, the umbilical cord insertion appears to be velamentous. I discussed the increased association with small for gestational age. In such cases we recommend serial growth exams.  A follow up exam is recommended in 4 weeks with transvaginal ultrasound.   All questions were answered  I spent 30 minutes with > 50% in face to face consultation.  Novella Olive, MD

## 2022-05-12 ENCOUNTER — Encounter: Payer: Self-pay | Admitting: Obstetrics and Gynecology

## 2022-05-12 ENCOUNTER — Ambulatory Visit (INDEPENDENT_AMBULATORY_CARE_PROVIDER_SITE_OTHER): Payer: BC Managed Care – PPO | Admitting: Obstetrics and Gynecology

## 2022-05-12 VITALS — BP 122/81 | HR 101 | Wt 270.0 lb

## 2022-05-12 DIAGNOSIS — O4400 Placenta previa specified as without hemorrhage, unspecified trimester: Secondary | ICD-10-CM | POA: Insufficient documentation

## 2022-05-12 DIAGNOSIS — O4402 Placenta previa specified as without hemorrhage, second trimester: Secondary | ICD-10-CM

## 2022-05-12 DIAGNOSIS — O43129 Velamentous insertion of umbilical cord, unspecified trimester: Secondary | ICD-10-CM | POA: Insufficient documentation

## 2022-05-12 DIAGNOSIS — Z3A24 24 weeks gestation of pregnancy: Secondary | ICD-10-CM

## 2022-05-12 DIAGNOSIS — O43122 Velamentous insertion of umbilical cord, second trimester: Secondary | ICD-10-CM

## 2022-05-12 DIAGNOSIS — Z148 Genetic carrier of other disease: Secondary | ICD-10-CM

## 2022-05-12 DIAGNOSIS — Z3481 Encounter for supervision of other normal pregnancy, first trimester: Secondary | ICD-10-CM

## 2022-05-12 NOTE — Progress Notes (Signed)
   PRENATAL VISIT NOTE  Subjective:  [name] is a 36 y.o. G3P1102 at [redacted]w[redacted]d being seen today for ongoing prenatal care.  She is currently monitored for the following issues for this high-risk pregnancy and has Alpha thalassemia silent carrier; Genetic carrier; Retained placenta w/o hemorrhage, delivered, current hospitalization; and Supervision of normal pregnancy in first trimester on their problem list.  Patient reports no complaints.  Contractions: Not present. Vag. Bleeding: None.  Movement: Present. Denies leaking of fluid.   The following portions of the patient's history were reviewed and updated as appropriate: allergies, current medications, past family history, past medical history, past social history, past surgical history and problem list.   Objective:   Vitals:   05/12/22 0842  BP: 122/81  Pulse: (!) 101  Weight: 270 lb (122.5 kg)    Fetal Status: Fetal Heart Rate (bpm): 145 Fundal Height: 24 cm Movement: Present     General:  Alert, oriented and cooperative. Patient is in no acute distress.  Skin: Skin is warm and dry. No rash noted.   Cardiovascular: Normal heart rate noted  Respiratory: Normal respiratory effort, no problems with respiration noted  Abdomen: Soft, gravid, appropriate for gestational age.  Pain/Pressure: Present     Pelvic: Cervical exam deferred        Extremities: Normal range of motion.     Mental Status: Normal mood and affect. Normal behavior. Normal judgment and thought content.   Assessment and Plan:  Pregnancy: G3P1102 at [redacted]w[redacted]d 1. Encounter for supervision of other normal pregnancy in first trimester Patient is doing well without complaints Third trimester labs next visit with glucola - ABO/Rh - Antibody screen - Rubella screen  2. Genetic carrier Partner testing negative  3. Placenta previa and velamentous cord insertion - Pelvic rest precautions reviewed Follow up ultrasound scheduled  Preterm labor symptoms and general  obstetric precautions including but not limited to vaginal bleeding, contractions, leaking of fluid and fetal movement were reviewed in detail with the patient. Please refer to After Visit Summary for other counseling recommendations.   Return in about 4 weeks (around 06/09/2022) for in person, ROB, High risk, 2 hr glucola next visit.  Future Appointments  Date Time Provider Department Center  06/04/2022  7:15 AM [facility] NURSE [facility]  06/04/2022  7:30 AM [facility] US3 [facility]    Catalina Antigua, MD

## 2022-05-12 NOTE — Progress Notes (Signed)
Pt would like to discuss latest u/s and findings- velamentous cord and previa

## 2022-05-13 LAB — ANTIBODY SCREEN: Antibody Screen: NEGATIVE

## 2022-05-13 LAB — ABO/RH: Rh Factor: POSITIVE

## 2022-05-13 LAB — RUBELLA SCREEN: Rubella Antibodies, IGG: 2.62 index (ref >0.99)

## 2022-05-18 ENCOUNTER — Encounter: Payer: Self-pay | Admitting: Advanced Practice Midwife

## 2022-05-20 ENCOUNTER — Encounter: Payer: BC Managed Care – PPO | Admitting: Obstetrics and Gynecology

## 2022-05-26 ENCOUNTER — Telehealth: Payer: Self-pay

## 2022-05-26 NOTE — Telephone Encounter (Signed)
Breast pump order received from [employer].   Order signed and faxed back.  Confirmation received 

## 2022-06-04 ENCOUNTER — Other Ambulatory Visit: Payer: Self-pay | Admitting: *Deleted

## 2022-06-04 ENCOUNTER — Ambulatory Visit: Payer: BC Managed Care – PPO | Attending: Maternal & Fetal Medicine | Admitting: Obstetrics

## 2022-06-04 ENCOUNTER — Ambulatory Visit: Payer: BC Managed Care – PPO | Attending: Obstetrics and Gynecology

## 2022-06-04 ENCOUNTER — Encounter: Payer: Self-pay | Admitting: *Deleted

## 2022-06-04 ENCOUNTER — Ambulatory Visit: Payer: BC Managed Care – PPO | Admitting: *Deleted

## 2022-06-04 VITALS — BP 114/53 | HR 104

## 2022-06-04 DIAGNOSIS — D563 Thalassemia minor: Secondary | ICD-10-CM

## 2022-06-04 DIAGNOSIS — O4392 Unspecified placental disorder, second trimester: Secondary | ICD-10-CM | POA: Insufficient documentation

## 2022-06-04 DIAGNOSIS — O99212 Obesity complicating pregnancy, second trimester: Secondary | ICD-10-CM

## 2022-06-04 DIAGNOSIS — Z6841 Body Mass Index (BMI) 40.0 and over, adult: Secondary | ICD-10-CM

## 2022-06-04 DIAGNOSIS — Z3A27 27 weeks gestation of pregnancy: Secondary | ICD-10-CM

## 2022-06-04 DIAGNOSIS — O4402 Placenta previa specified as without hemorrhage, second trimester: Secondary | ICD-10-CM

## 2022-06-04 DIAGNOSIS — O09899 Supervision of other high risk pregnancies, unspecified trimester: Secondary | ICD-10-CM

## 2022-06-04 DIAGNOSIS — O09212 Supervision of pregnancy with history of pre-term labor, second trimester: Secondary | ICD-10-CM

## 2022-06-04 DIAGNOSIS — O285 Abnormal chromosomal and genetic finding on antenatal screening of mother: Secondary | ICD-10-CM | POA: Diagnosis not present

## 2022-06-04 DIAGNOSIS — E669 Obesity, unspecified: Secondary | ICD-10-CM

## 2022-06-04 NOTE — Progress Notes (Signed)
MFM Note  [name] was seen for a follow up exam due to maternal obesity with a BMI of 44. Possible placenta previa with an accessory lobe and a possible velamentous cord insertion were noted during her last exam.  She denies any problems since her last exam.  She was informed that the fetal growth and amniotic fluid level appears appropriate for her gestational age.  The views of the fetal anatomy remain limited today due to maternal body habitus.  Vasa previa is noted on today's exam.   There is a posterior main lobe and an anterior accessory lobe of the placenta that is connected by a blood vessel over the lower uterine segment.  The posterior main lobe continues to overlie the cervix indicating that a placenta previa is still present.    Due to placenta previa with vasa previa, a cesarean delivery should be performed at between 35 to 36 weeks.    She should receive a complete course of antenatal corticosteroids prior to delivery.    Precautions associated with vasa previa were reviewed.  Pelvic rest was advised.    Weekly fetal testing using NSTs should be started at around 32 weeks to assess the fetal status and to assess for contractions.  She should go to the hospital immediately should she experience any vaginal bleeding or leakage of fluid.    A follow-up exam was scheduled in 4 weeks.    The patient stated that all of her questions were answered today.  A total of 20 minutes was spent counseling and coordinating the care for this patient.  Greater than 50% of the time was spent in direct face-to-face contact.

## 2022-06-09 ENCOUNTER — Encounter: Payer: Self-pay | Admitting: Obstetrics and Gynecology

## 2022-06-09 ENCOUNTER — Ambulatory Visit (INDEPENDENT_AMBULATORY_CARE_PROVIDER_SITE_OTHER): Payer: BC Managed Care – PPO | Admitting: Obstetrics and Gynecology

## 2022-06-09 ENCOUNTER — Other Ambulatory Visit: Payer: BC Managed Care – PPO

## 2022-06-09 VITALS — BP 120/76 | HR 96 | Wt 277.8 lb

## 2022-06-09 DIAGNOSIS — O0990 Supervision of high risk pregnancy, unspecified, unspecified trimester: Secondary | ICD-10-CM

## 2022-06-09 DIAGNOSIS — Z148 Genetic carrier of other disease: Secondary | ICD-10-CM

## 2022-06-09 DIAGNOSIS — D563 Thalassemia minor: Secondary | ICD-10-CM

## 2022-06-09 DIAGNOSIS — O4400 Placenta previa specified as without hemorrhage, unspecified trimester: Secondary | ICD-10-CM

## 2022-06-09 DIAGNOSIS — O43129 Velamentous insertion of umbilical cord, unspecified trimester: Secondary | ICD-10-CM

## 2022-06-09 NOTE — Progress Notes (Signed)
Subjective:  [name] is a 36 y.o. G3P1102 at [redacted]w[redacted]d being seen today for ongoing prenatal care.  She is currently monitored for the following issues for this high-risk pregnancy and has Alpha thalassemia silent carrier; Genetic carrier; Supervision of high risk pregnancy, antepartum; Velamentous insertion of umbilical cord, antepartum; and Placenta previa antepartum on their problem list.  Patient reports no complaints.  Contractions: Not present. Vag. Bleeding: None.  Movement: Present. Denies leaking of fluid.   The following portions of the patient's history were reviewed and updated as appropriate: allergies, current medications, past family history, past medical history, past social history, past surgical history and problem list. Problem list updated.  Objective:   Vitals:   06/09/22 0820  BP: 120/76  Pulse: 96  Weight: 277 lb 12.8 oz (126 kg)    Fetal Status: Fetal Heart Rate (bpm): 148   Movement: Present     General:  Alert, oriented and cooperative. Patient is in no acute distress.  Skin: Skin is warm and dry. No rash noted.   Cardiovascular: Normal heart rate noted  Respiratory: Normal respiratory effort, no problems with respiration noted  Abdomen: Soft, gravid, appropriate for gestational age. Pain/Pressure: Absent     Pelvic:  Cervical exam deferred        Extremities: Normal range of motion.  Edema: None  Mental Status: Normal mood and affect. Normal behavior. Normal judgment and thought content.   Urinalysis:      Assessment and Plan:  Pregnancy: G3P1102 at [redacted]w[redacted]d  1. Supervision of high risk pregnancy, antepartum Stable Glucola today  2. Velamentous insertion of umbilical cord, antepartum Reviewed with pt See MFM note  3. Placenta previa antepartum See above  4. Genetic carrier Husband negative  5. Alpha thalassemia silent carrier Stable  Preterm labor symptoms and general obstetric precautions including but not limited to vaginal bleeding,  contractions, leaking of fluid and fetal movement were reviewed in detail with the patient. Please refer to After Visit Summary for other counseling recommendations.  Return in about 2 weeks (around 06/23/2022) for face to face, MD only, OB visit, faculty MD only.   Hermina Staggers, MD

## 2022-06-09 NOTE — Progress Notes (Signed)
PT presents for ROB and 2 gtt labs. Declined Tdap  PHQ9=0 GAD7= 15 declined counseling

## 2022-06-09 NOTE — Patient Instructions (Signed)

## 2022-06-10 ENCOUNTER — Other Ambulatory Visit: Payer: Self-pay

## 2022-06-10 DIAGNOSIS — O24419 Gestational diabetes mellitus in pregnancy, unspecified control: Secondary | ICD-10-CM

## 2022-06-10 LAB — CBC
Hematocrit: 35.8 % (ref 34.0–46.6)
Hemoglobin: 12 g/dL (ref 11.1–15.9)
MCH: 27.3 pg (ref 26.6–33.0)
MCHC: 33.5 g/dL (ref 31.5–35.7)
MCV: 81 fL (ref 79–97)
Platelets: 208 10*3/uL (ref 150–450)
RBC: 4.4 x10E6/uL (ref 3.77–5.28)
RDW: 14 % (ref 11.7–15.4)
WBC: 8.6 10*3/uL (ref 3.4–10.8)

## 2022-06-10 LAB — GLUCOSE TOLERANCE, 2 HOURS W/ 1HR
Glucose, 1 hour: 189 mg/dL — ABNORMAL HIGH (ref 70–179)
Glucose, 2 hour: 111 mg/dL (ref 70–152)
Glucose, Fasting: 94 mg/dL — ABNORMAL HIGH (ref 70–91)

## 2022-06-10 LAB — HIV ANTIBODY (ROUTINE TESTING W REFLEX): HIV Screen 4th Generation wRfx: NONREACTIVE

## 2022-06-10 LAB — RPR: RPR Ser Ql: NONREACTIVE

## 2022-06-10 MED ORDER — ACCU-CHEK SOFTCLIX LANCETS MISC: 100.0000 | Freq: Four times a day (QID) | 12 refills | Status: DC

## 2022-06-10 MED ORDER — ACCU-CHEK AVIVA PLUS W/DEVICE KIT: 1.0000 | PACK | Freq: Every day | 0 refills | Status: DC

## 2022-06-10 MED ORDER — GLUCOSE BLOOD VI STRP: ORAL_STRIP | 12 refills | Status: DC

## 2022-06-23 ENCOUNTER — Other Ambulatory Visit: Payer: Self-pay

## 2022-06-23 ENCOUNTER — Encounter: Payer: Self-pay | Admitting: Obstetrics and Gynecology

## 2022-06-23 ENCOUNTER — Ambulatory Visit (INDEPENDENT_AMBULATORY_CARE_PROVIDER_SITE_OTHER): Payer: BC Managed Care – PPO | Admitting: Obstetrics and Gynecology

## 2022-06-23 ENCOUNTER — Encounter: Payer: BC Managed Care – PPO | Attending: Obstetrics and Gynecology | Admitting: Registered"

## 2022-06-23 ENCOUNTER — Ambulatory Visit (INDEPENDENT_AMBULATORY_CARE_PROVIDER_SITE_OTHER): Payer: BC Managed Care – PPO | Admitting: Registered"

## 2022-06-23 VITALS — BP 117/78 | HR 103 | Wt 278.0 lb

## 2022-06-23 DIAGNOSIS — Z713 Dietary counseling and surveillance: Secondary | ICD-10-CM | POA: Diagnosis not present

## 2022-06-23 DIAGNOSIS — O43129 Velamentous insertion of umbilical cord, unspecified trimester: Secondary | ICD-10-CM

## 2022-06-23 DIAGNOSIS — O24419 Gestational diabetes mellitus in pregnancy, unspecified control: Secondary | ICD-10-CM | POA: Insufficient documentation

## 2022-06-23 DIAGNOSIS — Z3A30 30 weeks gestation of pregnancy: Secondary | ICD-10-CM

## 2022-06-23 DIAGNOSIS — O24410 Gestational diabetes mellitus in pregnancy, diet controlled: Secondary | ICD-10-CM

## 2022-06-23 DIAGNOSIS — D563 Thalassemia minor: Secondary | ICD-10-CM

## 2022-06-23 DIAGNOSIS — O0990 Supervision of high risk pregnancy, unspecified, unspecified trimester: Secondary | ICD-10-CM

## 2022-06-23 DIAGNOSIS — O4400 Placenta previa specified as without hemorrhage, unspecified trimester: Secondary | ICD-10-CM

## 2022-06-23 DIAGNOSIS — O43123 Velamentous insertion of umbilical cord, third trimester: Secondary | ICD-10-CM

## 2022-06-23 DIAGNOSIS — O0993 Supervision of high risk pregnancy, unspecified, third trimester: Secondary | ICD-10-CM

## 2022-06-23 DIAGNOSIS — O4403 Placenta previa specified as without hemorrhage, third trimester: Secondary | ICD-10-CM

## 2022-06-23 DIAGNOSIS — Z3A00 Weeks of gestation of pregnancy not specified: Secondary | ICD-10-CM | POA: Diagnosis not present

## 2022-06-23 HISTORY — DX: Gestational diabetes mellitus in pregnancy, diet controlled: O24.410

## 2022-06-23 NOTE — Progress Notes (Signed)
Pt had appt with N&D today.  Pt states sugars have been doing good.  Pt states she has several questions about delivery.

## 2022-06-23 NOTE — Progress Notes (Signed)
Patient was seen for Gestational Diabetes self-management on 06/23/22  Start time 1320 and End time 1413   Estimated due date: 08-31-2022  Clinical: Medications: reviewed Medical History: reviewed Labs: OGTT 06/10/22: 94-189, A1c 04/17/22: 5.4%   Dietary and Lifestyle History: Pt states that this pregnancy has been difficult due to complications, busy schedule, pelvic rest, high stress, and recent GDM diagnosis. She also states it has been difficult because she has been craving sugary foods.  Pt states she has made recent changes to her diet including having more balanced meals/snacks. Pt states she drinks 1 cup of coffee daily to avoid migraines.   Physical Activity: ADLs, pt on pelvic rest Stress: pt states she has high stress with this pregnancy Sleep: 5 hours  24 hr Recall: not assessed this visit First Meal:  Snack: Second meal: Snack: cheese and grapes OR nuts and dried fruit OR triscuits and cheese Third meal: (sample) chicken breast, frozen vegetables, activia yogurt Snack:  Beverages: 60oz water, occasional diet soda  NUTRITION INTERVENTION  Nutrition education (E-1) on the following topics:   Initial Follow-up  [x]  []  Definition of Gestational Diabetes [x]  []  Why dietary management is important in controlling blood glucose [x]  []  Effects each nutrient has on blood glucose levels [x]  []  Simple carbohydrates vs complex carbohydrates [x]  []  Fluid intake  [x]  []  Creating a balanced meal plan [x]  []  Carbohydrate counting  [x]  []  When to check blood glucose levels [x]  []  Proper blood glucose monitoring techniques [x]  []  Effect of stress and stress reduction techniques  [x]  []  Exercise effect on blood glucose levels, appropriate exercise during pregnancy [x]  []  Importance of limiting caffeine and abstaining from alcohol and smoking []  []  Medications used for blood sugar control during pregnancy [x]  []  Hypoglycemia and rule of 15 [x]  []  Postpartum self care  Patient  has a meter prior to visit. Patient is instructed to test pre breakfast and 2 hours after each meal. FBS: 110mg /dL (pt report) Postprandial: 140mg /dL (pt report)  Patient instructed to monitor glucose levels: FBS: 60 - ? 95 mg/dL  2 hour: ? mg/dL  Patient received handouts: Nutrition Diabetes and Pregnancy Carbohydrate Counting List  Patient will be seen for follow-up as needed.

## 2022-06-23 NOTE — Progress Notes (Signed)
   PRENATAL VISIT NOTE  Subjective:  [name] is a 36 y.o. G3P1102 at [redacted]w[redacted]d being seen today for ongoing prenatal care.  She is currently monitored for the following issues for this high-risk pregnancy and has Alpha thalassemia silent carrier; Genetic carrier; Supervision of high risk pregnancy, antepartum; Velamentous insertion of umbilical cord, antepartum; and Placenta previa antepartum on their problem list.  Patient doing well with no acute concerns today. She reports no complaints.  Contractions: Not present. Vag. Bleeding: None.  Movement: Present. Denies leaking of fluid.   The following portions of the patient's history were reviewed and updated as appropriate: allergies, current medications, past family history, past medical history, past social history, past surgical history and problem list. Problem list updated.  Objective:   Vitals:   06/23/22 1455  BP: 117/78  Pulse: (!) 103  Weight: 278 lb (126.1 kg)    Fetal Status: Fetal Heart Rate (bpm): 145 Fundal Height: 32 cm Movement: Present     General:  Alert, oriented and cooperative. Patient is in no acute distress.  Skin: Skin is warm and dry. No rash noted.   Cardiovascular: Normal heart rate noted  Respiratory: Normal respiratory effort, no problems with respiration noted  Abdomen: Soft, gravid, appropriate for gestational age.  Pain/Pressure: Present     Pelvic: Cervical exam deferred        Extremities: Normal range of motion.     Mental Status:  Normal mood and affect. Normal behavior. Normal judgment and thought content.   Assessment and Plan:  Pregnancy: G3P1102 at [redacted]w[redacted]d  1. [redacted] weeks gestation of pregnancy   2. Velamentous insertion of umbilical cord, antepartum Will schedule LTCS at or around 07/30/22 at 35.3 weeks Weekly NST at 32 weeks Pt has inquired about potential inpatient admission 1-2 weeks prior to delivery for safety as she lives in [place]  3. Alpha thalassemia silent  carrier   4. Supervision of high risk pregnancy, antepartum Continue routine prenatal care Pt just diagnosed with A1 GDM and has just received diet and nutrition training  5. Placenta previa antepartum See above  Preterm labor symptoms and general obstetric precautions including but not limited to vaginal bleeding, contractions, leaking of fluid and fetal movement were reviewed in detail with the patient.  Please refer to After Visit Summary for other counseling recommendations.   Return in about 2 weeks (around 07/07/2022) for [facility], in person.   Mariel Aloe, MD Faculty Attending Center for [facility]

## 2022-06-24 ENCOUNTER — Encounter: Payer: Self-pay | Admitting: Obstetrics and Gynecology

## 2022-07-02 ENCOUNTER — Other Ambulatory Visit: Payer: Self-pay | Admitting: Obstetrics

## 2022-07-02 ENCOUNTER — Ambulatory Visit: Payer: BC Managed Care – PPO | Attending: Obstetrics

## 2022-07-02 ENCOUNTER — Ambulatory Visit: Payer: BC Managed Care – PPO | Admitting: *Deleted

## 2022-07-02 VITALS — BP 120/52 | HR 103

## 2022-07-02 DIAGNOSIS — O285 Abnormal chromosomal and genetic finding on antenatal screening of mother: Secondary | ICD-10-CM

## 2022-07-02 DIAGNOSIS — E669 Obesity, unspecified: Secondary | ICD-10-CM

## 2022-07-02 DIAGNOSIS — O0990 Supervision of high risk pregnancy, unspecified, unspecified trimester: Secondary | ICD-10-CM

## 2022-07-02 DIAGNOSIS — O09899 Supervision of other high risk pregnancies, unspecified trimester: Secondary | ICD-10-CM | POA: Diagnosis present

## 2022-07-02 DIAGNOSIS — O403XX0 Polyhydramnios, third trimester, not applicable or unspecified: Secondary | ICD-10-CM | POA: Insufficient documentation

## 2022-07-02 DIAGNOSIS — O09893 Supervision of other high risk pregnancies, third trimester: Secondary | ICD-10-CM | POA: Diagnosis present

## 2022-07-02 DIAGNOSIS — O4400 Placenta previa specified as without hemorrhage, unspecified trimester: Secondary | ICD-10-CM | POA: Diagnosis present

## 2022-07-02 DIAGNOSIS — Z6841 Body Mass Index (BMI) 40.0 and over, adult: Secondary | ICD-10-CM | POA: Diagnosis present

## 2022-07-02 DIAGNOSIS — O4403 Placenta previa specified as without hemorrhage, third trimester: Secondary | ICD-10-CM | POA: Diagnosis not present

## 2022-07-02 DIAGNOSIS — Z3A31 31 weeks gestation of pregnancy: Secondary | ICD-10-CM | POA: Insufficient documentation

## 2022-07-02 DIAGNOSIS — O43129 Velamentous insertion of umbilical cord, unspecified trimester: Secondary | ICD-10-CM

## 2022-07-02 DIAGNOSIS — O24410 Gestational diabetes mellitus in pregnancy, diet controlled: Secondary | ICD-10-CM | POA: Diagnosis not present

## 2022-07-02 DIAGNOSIS — O09213 Supervision of pregnancy with history of pre-term labor, third trimester: Secondary | ICD-10-CM | POA: Diagnosis not present

## 2022-07-02 DIAGNOSIS — O99213 Obesity complicating pregnancy, third trimester: Secondary | ICD-10-CM | POA: Insufficient documentation

## 2022-07-02 DIAGNOSIS — O43123 Velamentous insertion of umbilical cord, third trimester: Secondary | ICD-10-CM | POA: Insufficient documentation

## 2022-07-02 DIAGNOSIS — O09293 Supervision of pregnancy with other poor reproductive or obstetric history, third trimester: Secondary | ICD-10-CM | POA: Insufficient documentation

## 2022-07-02 DIAGNOSIS — D563 Thalassemia minor: Secondary | ICD-10-CM

## 2022-07-03 ENCOUNTER — Other Ambulatory Visit: Payer: Self-pay | Admitting: *Deleted

## 2022-07-03 ENCOUNTER — Encounter: Payer: Self-pay | Admitting: Obstetrics and Gynecology

## 2022-07-03 DIAGNOSIS — O403XX0 Polyhydramnios, third trimester, not applicable or unspecified: Secondary | ICD-10-CM

## 2022-07-03 DIAGNOSIS — O24410 Gestational diabetes mellitus in pregnancy, diet controlled: Secondary | ICD-10-CM

## 2022-07-03 DIAGNOSIS — O99210 Obesity complicating pregnancy, unspecified trimester: Secondary | ICD-10-CM

## 2022-07-03 DIAGNOSIS — O4400 Placenta previa specified as without hemorrhage, unspecified trimester: Secondary | ICD-10-CM

## 2022-07-07 ENCOUNTER — Encounter: Payer: BC Managed Care – PPO | Admitting: Obstetrics & Gynecology

## 2022-07-07 ENCOUNTER — Ambulatory Visit (INDEPENDENT_AMBULATORY_CARE_PROVIDER_SITE_OTHER): Payer: BC Managed Care – PPO

## 2022-07-07 VITALS — BP 120/85 | HR 94 | Wt 278.0 lb

## 2022-07-07 DIAGNOSIS — Z3A32 32 weeks gestation of pregnancy: Secondary | ICD-10-CM

## 2022-07-07 DIAGNOSIS — O409XX0 Polyhydramnios, unspecified trimester, not applicable or unspecified: Secondary | ICD-10-CM | POA: Insufficient documentation

## 2022-07-07 DIAGNOSIS — O24419 Gestational diabetes mellitus in pregnancy, unspecified control: Secondary | ICD-10-CM

## 2022-07-07 DIAGNOSIS — O403XX0 Polyhydramnios, third trimester, not applicable or unspecified: Secondary | ICD-10-CM

## 2022-07-07 DIAGNOSIS — O0990 Supervision of high risk pregnancy, unspecified, unspecified trimester: Secondary | ICD-10-CM

## 2022-07-07 NOTE — Progress Notes (Signed)
HIGH-RISK PREGNANCY OFFICE VISIT  Patient name: [name] MRN [id number]  Date of birth: [birth year] Chief Complaint:   Routine Prenatal Visit  Subjective:   [name] is a 36 y.o. G60P1102 female at [redacted]w[redacted]d with an Estimated Date of Delivery: 08/31/22 being seen today for ongoing management of a high-risk pregnancy aeb has Alpha thalassemia silent carrier; Genetic carrier; Supervision of high risk pregnancy, antepartum; Velamentous insertion of umbilical cord, antepartum; Placenta previa antepartum; and Diet controlled gestational diabetes mellitus on their problem list.  Patient presents today with no complaints.  Patient endorses fetal movement. Patient denies abdominal cramping or contractions.  Patient denies vaginal concerns including abnormal discharge, leaking of fluid, and bleeding. Reports she will not be receiving a C/S, but is having anxiety regarding delivery r/t history of retained placenta and polyhydramnios.   Contractions: Not present. Vag. Bleeding: None.  Movement: Present.  Reviewed past medical,surgical, social, obstetrical and family history as well as problem list, medications and allergies.  Objective   Vitals:   07/07/22 1330  BP: 120/85  Pulse: 94  Weight: 278 lb (126.1 kg)  Body mass index is 46.26 kg/m.  Total Weight Gain:18 lb (8.165 kg)         Physical Examination:   General appearance: Well appearing, and in no distress  Mental status: Alert, oriented to person, place, and time  Skin: Warm & dry  Cardiovascular: Normal heart rate noted  Respiratory: Normal respiratory effort, no distress  Abdomen: Soft, gravid, nontender, LGA with Fundal Height: 38 cm  Pelvic: Cervical exam deferred           Extremities:    Fetal Status: Fetal Heart Rate (bpm): 140  Movement: Present   No results found for this or any previous visit (from the past 24 hour(s)).  Assessment & Plan:  High-risk pregnancy of a 36 y.o., [id number] at [redacted]w[redacted]d with an Estimated  Date of Delivery: 08/31/22   1. Supervision of high risk pregnancy, antepartum -Anticipatory guidance for upcoming appts. -Patient to schedule next appt in 3 weeks for an in-person visit. -Message to be sent to J. Battler with cc on Dr. Hazle Coca for cancellation of C/S.   2. Gestational diabetes mellitus (GDM) in third trimester, gestational diabetes method of control unspecified -Reports BS are well.  States sometimes elevated by "one or two points."   -Reports fasting has been under 110.  Informed that fasting should be </=95. Reports fasting has been under 110. -Instructed to scan records into mychart for provider review.   3. H/O Retained placenta w/o hemorrhage, -Discussed active 3rd stage management with Pitocin and TXA if necessary.  4. Polyhydramnios in third trimester complication, single or unspecified fetus -AFI 32. -Scheduled for repeat US. -Discussed diagnosis and concerns related to fetus, labor, and delivery. Also discussed how GDM plays role. -Plan for IOL at 37 weeks per MFM.   5. [redacted] weeks gestation of pregnancy -Doing well. -Excited to see provider! Provider shares that sentiment. -Discussed ongoing positive life outcomes; pregnancy, new home, marriage.     Meds: No orders of the defined types were placed in this encounter.  Labs/procedures today:  Lab Orders  No laboratory test(s) ordered today     Reviewed: Preterm labor symptoms and general obstetric precautions including but not limited to vaginal bleeding, contractions, leaking of fluid and fetal movement were reviewed in detail with the patient.  All questions were answered.  Follow-up: No follow-ups on file.  No orders of the defined types were  placed in this encounter.  Cherre Robins MSN, CNM 07/07/2022

## 2022-07-07 NOTE — Progress Notes (Signed)
Pt would like to discuss recent u/s and delivery options.

## 2022-07-09 ENCOUNTER — Ambulatory Visit: Payer: BC Managed Care – PPO | Attending: Obstetrics and Gynecology

## 2022-07-09 ENCOUNTER — Other Ambulatory Visit: Payer: Self-pay | Admitting: *Deleted

## 2022-07-09 ENCOUNTER — Ambulatory Visit: Payer: BC Managed Care – PPO | Admitting: *Deleted

## 2022-07-09 VITALS — BP 122/68 | HR 103

## 2022-07-09 DIAGNOSIS — O403XX0 Polyhydramnios, third trimester, not applicable or unspecified: Secondary | ICD-10-CM | POA: Diagnosis not present

## 2022-07-09 DIAGNOSIS — O4403 Placenta previa specified as without hemorrhage, third trimester: Secondary | ICD-10-CM

## 2022-07-09 DIAGNOSIS — O409XX0 Polyhydramnios, unspecified trimester, not applicable or unspecified: Secondary | ICD-10-CM | POA: Diagnosis present

## 2022-07-09 DIAGNOSIS — O4400 Placenta previa specified as without hemorrhage, unspecified trimester: Secondary | ICD-10-CM | POA: Diagnosis present

## 2022-07-09 DIAGNOSIS — O99213 Obesity complicating pregnancy, third trimester: Secondary | ICD-10-CM | POA: Diagnosis not present

## 2022-07-09 DIAGNOSIS — O0990 Supervision of high risk pregnancy, unspecified, unspecified trimester: Secondary | ICD-10-CM | POA: Diagnosis present

## 2022-07-09 DIAGNOSIS — O24410 Gestational diabetes mellitus in pregnancy, diet controlled: Secondary | ICD-10-CM

## 2022-07-09 DIAGNOSIS — O99210 Obesity complicating pregnancy, unspecified trimester: Secondary | ICD-10-CM | POA: Diagnosis present

## 2022-07-09 DIAGNOSIS — O43129 Velamentous insertion of umbilical cord, unspecified trimester: Secondary | ICD-10-CM | POA: Diagnosis present

## 2022-07-09 DIAGNOSIS — Z362 Encounter for other antenatal screening follow-up: Secondary | ICD-10-CM

## 2022-07-09 DIAGNOSIS — Z148 Genetic carrier of other disease: Secondary | ICD-10-CM

## 2022-07-09 DIAGNOSIS — E669 Obesity, unspecified: Secondary | ICD-10-CM

## 2022-07-09 DIAGNOSIS — Z3A32 32 weeks gestation of pregnancy: Secondary | ICD-10-CM

## 2022-07-09 DIAGNOSIS — O24419 Gestational diabetes mellitus in pregnancy, unspecified control: Secondary | ICD-10-CM

## 2022-07-09 NOTE — Procedures (Signed)
[name] 01-May-1986 [redacted]w[redacted]d  Fetus A Non-Stress Test Interpretation for 07/09/22  Indication: Polyhydramnios, GDM  Fetal Heart Rate A Mode: External Baseline Rate (A): 135 bpm Variability: Moderate Accelerations: 15 x 15 Decelerations: Variable Multiple birth?: No  Uterine Activity Mode: Palpation, Toco Contraction Frequency (min): ui Resting Tone Palpated: Relaxed  Interpretation (Fetal Testing) Nonstress Test Interpretation: Reactive Overall Impression: Reassuring for gestational age Comments: Dr. Judeth Cornfield reviewed tracing

## 2022-07-17 ENCOUNTER — Ambulatory Visit: Payer: BC Managed Care – PPO | Admitting: *Deleted

## 2022-07-17 ENCOUNTER — Ambulatory Visit: Payer: BC Managed Care – PPO | Attending: Obstetrics and Gynecology

## 2022-07-17 VITALS — BP 121/72 | HR 98

## 2022-07-17 DIAGNOSIS — O409XX0 Polyhydramnios, unspecified trimester, not applicable or unspecified: Secondary | ICD-10-CM | POA: Diagnosis present

## 2022-07-17 DIAGNOSIS — O24419 Gestational diabetes mellitus in pregnancy, unspecified control: Secondary | ICD-10-CM | POA: Diagnosis present

## 2022-07-17 DIAGNOSIS — O0990 Supervision of high risk pregnancy, unspecified, unspecified trimester: Secondary | ICD-10-CM

## 2022-07-17 DIAGNOSIS — O99210 Obesity complicating pregnancy, unspecified trimester: Secondary | ICD-10-CM | POA: Insufficient documentation

## 2022-07-17 DIAGNOSIS — O99213 Obesity complicating pregnancy, third trimester: Secondary | ICD-10-CM | POA: Insufficient documentation

## 2022-07-17 DIAGNOSIS — O24410 Gestational diabetes mellitus in pregnancy, diet controlled: Secondary | ICD-10-CM | POA: Diagnosis not present

## 2022-07-17 DIAGNOSIS — O43129 Velamentous insertion of umbilical cord, unspecified trimester: Secondary | ICD-10-CM | POA: Insufficient documentation

## 2022-07-17 DIAGNOSIS — E669 Obesity, unspecified: Secondary | ICD-10-CM

## 2022-07-17 DIAGNOSIS — O403XX0 Polyhydramnios, third trimester, not applicable or unspecified: Secondary | ICD-10-CM

## 2022-07-17 DIAGNOSIS — O4400 Placenta previa specified as without hemorrhage, unspecified trimester: Secondary | ICD-10-CM

## 2022-07-17 DIAGNOSIS — Z3A33 33 weeks gestation of pregnancy: Secondary | ICD-10-CM

## 2022-07-17 NOTE — Procedures (Signed)
[name] 1986/10/17 [redacted]w[redacted]d  Fetus A Non-Stress Test Interpretation for 07/17/22  Indication: Polyhydramnios  Fetal Heart Rate A Mode: External Baseline Rate (A): 150 bpm Variability: Moderate Accelerations: 15 x 15 Decelerations: None Multiple birth?: No  Uterine Activity Mode: Palpation, Toco Contraction Frequency (min): 4-7 Contraction Duration (sec): 40-70 Contraction Quality: Mild Resting Tone Palpated: Relaxed Resting Time: Adequate  Interpretation (Fetal Testing) Nonstress Test Interpretation: Reactive Overall Impression: Reassuring for gestational age Comments: Dr. Annamaria Boots reviewed tracing

## 2022-07-21 ENCOUNTER — Encounter: Payer: BC Managed Care – PPO | Admitting: Obstetrics and Gynecology

## 2022-07-23 ENCOUNTER — Other Ambulatory Visit: Payer: Self-pay | Admitting: Obstetrics and Gynecology

## 2022-07-23 ENCOUNTER — Other Ambulatory Visit: Payer: Self-pay | Admitting: *Deleted

## 2022-07-23 ENCOUNTER — Ambulatory Visit: Payer: BC Managed Care – PPO | Admitting: *Deleted

## 2022-07-23 ENCOUNTER — Ambulatory Visit: Payer: BC Managed Care – PPO | Attending: Obstetrics and Gynecology

## 2022-07-23 ENCOUNTER — Other Ambulatory Visit: Payer: BC Managed Care – PPO

## 2022-07-23 VITALS — BP 124/68 | HR 100

## 2022-07-23 DIAGNOSIS — O0990 Supervision of high risk pregnancy, unspecified, unspecified trimester: Secondary | ICD-10-CM | POA: Diagnosis present

## 2022-07-23 DIAGNOSIS — D563 Thalassemia minor: Secondary | ICD-10-CM

## 2022-07-23 DIAGNOSIS — E669 Obesity, unspecified: Secondary | ICD-10-CM

## 2022-07-23 DIAGNOSIS — O43129 Velamentous insertion of umbilical cord, unspecified trimester: Secondary | ICD-10-CM | POA: Insufficient documentation

## 2022-07-23 DIAGNOSIS — O24410 Gestational diabetes mellitus in pregnancy, diet controlled: Secondary | ICD-10-CM | POA: Diagnosis not present

## 2022-07-23 DIAGNOSIS — O09213 Supervision of pregnancy with history of pre-term labor, third trimester: Secondary | ICD-10-CM

## 2022-07-23 DIAGNOSIS — O403XX0 Polyhydramnios, third trimester, not applicable or unspecified: Secondary | ICD-10-CM

## 2022-07-23 DIAGNOSIS — O285 Abnormal chromosomal and genetic finding on antenatal screening of mother: Secondary | ICD-10-CM | POA: Diagnosis not present

## 2022-07-23 DIAGNOSIS — O99213 Obesity complicating pregnancy, third trimester: Secondary | ICD-10-CM

## 2022-07-23 DIAGNOSIS — O4400 Placenta previa specified as without hemorrhage, unspecified trimester: Secondary | ICD-10-CM

## 2022-07-23 DIAGNOSIS — O99210 Obesity complicating pregnancy, unspecified trimester: Secondary | ICD-10-CM | POA: Insufficient documentation

## 2022-07-23 DIAGNOSIS — O43123 Velamentous insertion of umbilical cord, third trimester: Secondary | ICD-10-CM

## 2022-07-23 DIAGNOSIS — Z3A34 34 weeks gestation of pregnancy: Secondary | ICD-10-CM

## 2022-07-30 ENCOUNTER — Ambulatory Visit: Payer: BC Managed Care – PPO

## 2022-07-30 ENCOUNTER — Ambulatory Visit (INDEPENDENT_AMBULATORY_CARE_PROVIDER_SITE_OTHER): Payer: BC Managed Care – PPO

## 2022-07-30 ENCOUNTER — Encounter (HOSPITAL_COMMUNITY): Payer: Self-pay

## 2022-07-30 ENCOUNTER — Inpatient Hospital Stay (HOSPITAL_COMMUNITY): Admit: 2022-07-30 | Payer: BC Managed Care – PPO | Admitting: Obstetrics & Gynecology

## 2022-07-30 ENCOUNTER — Other Ambulatory Visit: Payer: BC Managed Care – PPO

## 2022-07-30 VITALS — BP 119/71 | HR 105 | Wt 279.6 lb

## 2022-07-30 DIAGNOSIS — O0993 Supervision of high risk pregnancy, unspecified, third trimester: Secondary | ICD-10-CM

## 2022-07-30 DIAGNOSIS — Z3A35 35 weeks gestation of pregnancy: Secondary | ICD-10-CM

## 2022-07-30 DIAGNOSIS — O0990 Supervision of high risk pregnancy, unspecified, unspecified trimester: Secondary | ICD-10-CM

## 2022-07-30 DIAGNOSIS — O24419 Gestational diabetes mellitus in pregnancy, unspecified control: Secondary | ICD-10-CM

## 2022-07-30 SURGERY — Surgical Case
Anesthesia: Regional

## 2022-07-30 NOTE — Progress Notes (Signed)
   HIGH-RISK PREGNANCY OFFICE VISIT  Patient name: [name] MRN [id number]  Date of birth: 1986/04/16 Chief Complaint:   Routine Prenatal Visit  Subjective:   [name] is a 36 y.o. G46P1102 female at [redacted]w[redacted]d with an Estimated Date of Delivery: 08/31/22 being seen today for ongoing management of a high-risk pregnancy aeb has Alpha thalassemia silent carrier; Genetic carrier; Supervision of high risk pregnancy, antepartum; Velamentous insertion of umbilical cord, antepartum; Placenta previa antepartum; Diet controlled gestational diabetes mellitus; and Polyhydramnios on their problem list.  Patient presents today, alone, with no complaints.  Patient endorses fetal movement. Patient denies abdominal cramping or contractions.  Patient denies vaginal concerns including abnormal discharge, leaking of fluid, and bleeding.  Contractions: Irritability. Vag. Bleeding: None.  Movement: Present.  Reviewed past medical,surgical, social, obstetrical and family history as well as problem list, medications and allergies.  Objective   Vitals:   07/30/22 1043  BP: 119/71  Pulse: (!) 105  Weight: 279 lb 9.6 oz (126.8 kg)  Body mass index is 46.53 kg/m.  Total Weight Gain:19 lb 9.6 oz (8.891 kg)         Physical Examination:   General appearance: Well appearing, and in no distress  Mental status: Alert, oriented to person, place, and time  Skin: Warm & dry  Cardiovascular: Normal heart rate noted  Respiratory: Normal respiratory effort, no distress  Abdomen: Soft, gravid, nontender, AGA with Fundal Height: 42 cm  Pelvic: Cervical exam deferred           Extremities: Edema: Trace  Fetal Status: Fetal Heart Rate (bpm): 156  Movement: Present   No results found for this or any previous visit (from the past 24 hour(s)).  Assessment & Plan:  High-risk pregnancy of a 36 y.o., [id number] at [redacted]w[redacted]d with an Estimated Date of Delivery: 08/31/22   1. Supervision of high risk pregnancy,  antepartum -Anticipatory guidance for upcoming appts. -Patient to schedule next appt in 1 weeks for an in-person visit. -Reviewed recent US which shows improved AFI and no previa.  Recommendation for delivery at 39 weeks. -Reviewed recommendation.   2. Gestational diabetes mellitus (GDM) in third trimester, gestational diabetes method of control unspecified -Reports sugars good. -Fasting is low, but dinners can be elevated. Reports that she eats lots of fruit.  3. [redacted] weeks gestation of pregnancy -Doing well overall. -Reports some stressors related to toddler son recent hospitalization, but coping well.      Meds: No orders of the defined types were placed in this encounter.  Labs/procedures today:  Lab Orders  No laboratory test(s) ordered today     Reviewed: Preterm labor symptoms and general obstetric precautions including but not limited to vaginal bleeding, contractions, leaking of fluid and fetal movement were reviewed in detail with the patient.  All questions were answered.  Follow-up: No follow-ups on file.  No orders of the defined types were placed in this encounter.  Maryann Conners MSN, CNM 07/30/2022

## 2022-07-30 NOTE — Progress Notes (Signed)
Pt presents for ROB. Endorses +FM, denies ctx, LOF, vaginal bleeding.  Reports FBS 90-95, postprandials under 120.

## 2022-07-31 ENCOUNTER — Ambulatory Visit: Payer: BC Managed Care – PPO | Attending: Obstetrics and Gynecology

## 2022-07-31 ENCOUNTER — Ambulatory Visit: Payer: BC Managed Care – PPO

## 2022-07-31 ENCOUNTER — Other Ambulatory Visit: Payer: Self-pay | Admitting: Obstetrics and Gynecology

## 2022-07-31 ENCOUNTER — Ambulatory Visit: Payer: BC Managed Care – PPO | Admitting: *Deleted

## 2022-07-31 VITALS — BP 121/61 | HR 93

## 2022-07-31 DIAGNOSIS — O99210 Obesity complicating pregnancy, unspecified trimester: Secondary | ICD-10-CM

## 2022-07-31 DIAGNOSIS — O0990 Supervision of high risk pregnancy, unspecified, unspecified trimester: Secondary | ICD-10-CM | POA: Insufficient documentation

## 2022-07-31 DIAGNOSIS — O4400 Placenta previa specified as without hemorrhage, unspecified trimester: Secondary | ICD-10-CM

## 2022-07-31 DIAGNOSIS — O43129 Velamentous insertion of umbilical cord, unspecified trimester: Secondary | ICD-10-CM | POA: Diagnosis present

## 2022-07-31 DIAGNOSIS — O403XX0 Polyhydramnios, third trimester, not applicable or unspecified: Secondary | ICD-10-CM

## 2022-07-31 DIAGNOSIS — Z3A35 35 weeks gestation of pregnancy: Secondary | ICD-10-CM

## 2022-07-31 DIAGNOSIS — O43123 Velamentous insertion of umbilical cord, third trimester: Secondary | ICD-10-CM | POA: Diagnosis not present

## 2022-07-31 DIAGNOSIS — E669 Obesity, unspecified: Secondary | ICD-10-CM

## 2022-07-31 DIAGNOSIS — O24410 Gestational diabetes mellitus in pregnancy, diet controlled: Secondary | ICD-10-CM | POA: Diagnosis not present

## 2022-07-31 DIAGNOSIS — O99213 Obesity complicating pregnancy, third trimester: Secondary | ICD-10-CM

## 2022-07-31 DIAGNOSIS — O09213 Supervision of pregnancy with history of pre-term labor, third trimester: Secondary | ICD-10-CM

## 2022-07-31 DIAGNOSIS — O285 Abnormal chromosomal and genetic finding on antenatal screening of mother: Secondary | ICD-10-CM

## 2022-07-31 DIAGNOSIS — D563 Thalassemia minor: Secondary | ICD-10-CM

## 2022-08-04 ENCOUNTER — Encounter: Payer: BC Managed Care – PPO | Admitting: Obstetrics & Gynecology

## 2022-08-04 ENCOUNTER — Other Ambulatory Visit: Payer: Self-pay | Admitting: *Deleted

## 2022-08-04 ENCOUNTER — Telehealth: Payer: Self-pay | Admitting: Emergency Medicine

## 2022-08-04 NOTE — Progress Notes (Signed)
Order for SI support belt signed and faxed to [employer].

## 2022-08-04 NOTE — Telephone Encounter (Signed)
Attempted RC to patient who has c/o mouth abscess. LVM

## 2022-08-07 ENCOUNTER — Ambulatory Visit: Payer: BC Managed Care – PPO | Attending: Obstetrics

## 2022-08-07 ENCOUNTER — Encounter: Payer: Self-pay | Admitting: *Deleted

## 2022-08-07 ENCOUNTER — Ambulatory Visit: Payer: BC Managed Care – PPO | Admitting: *Deleted

## 2022-08-07 VITALS — BP 122/60 | HR 90

## 2022-08-07 DIAGNOSIS — O43123 Velamentous insertion of umbilical cord, third trimester: Secondary | ICD-10-CM | POA: Diagnosis not present

## 2022-08-07 DIAGNOSIS — O24410 Gestational diabetes mellitus in pregnancy, diet controlled: Secondary | ICD-10-CM

## 2022-08-07 DIAGNOSIS — O09213 Supervision of pregnancy with history of pre-term labor, third trimester: Secondary | ICD-10-CM

## 2022-08-07 DIAGNOSIS — O43129 Velamentous insertion of umbilical cord, unspecified trimester: Secondary | ICD-10-CM

## 2022-08-07 DIAGNOSIS — E669 Obesity, unspecified: Secondary | ICD-10-CM

## 2022-08-07 DIAGNOSIS — O0990 Supervision of high risk pregnancy, unspecified, unspecified trimester: Secondary | ICD-10-CM | POA: Diagnosis present

## 2022-08-07 DIAGNOSIS — O99213 Obesity complicating pregnancy, third trimester: Secondary | ICD-10-CM | POA: Insufficient documentation

## 2022-08-07 DIAGNOSIS — O4400 Placenta previa specified as without hemorrhage, unspecified trimester: Secondary | ICD-10-CM | POA: Diagnosis present

## 2022-08-07 DIAGNOSIS — O403XX0 Polyhydramnios, third trimester, not applicable or unspecified: Secondary | ICD-10-CM

## 2022-08-07 DIAGNOSIS — Z3A36 36 weeks gestation of pregnancy: Secondary | ICD-10-CM

## 2022-08-10 ENCOUNTER — Encounter: Payer: Self-pay | Admitting: Obstetrics and Gynecology

## 2022-08-10 ENCOUNTER — Other Ambulatory Visit (HOSPITAL_COMMUNITY)
Admission: RE | Admit: 2022-08-10 | Discharge: 2022-08-10 | Disposition: A | Discharge status: Home / Self Care | Payer: BC Managed Care – PPO | Source: Ambulatory Visit | Attending: Obstetrics and Gynecology | Admitting: Obstetrics and Gynecology

## 2022-08-10 ENCOUNTER — Ambulatory Visit (INDEPENDENT_AMBULATORY_CARE_PROVIDER_SITE_OTHER): Payer: BC Managed Care – PPO | Admitting: Obstetrics and Gynecology

## 2022-08-10 VITALS — BP 118/78 | HR 106 | Wt 280.0 lb

## 2022-08-10 DIAGNOSIS — O0993 Supervision of high risk pregnancy, unspecified, third trimester: Secondary | ICD-10-CM

## 2022-08-10 DIAGNOSIS — Z3A37 37 weeks gestation of pregnancy: Secondary | ICD-10-CM

## 2022-08-10 DIAGNOSIS — O403XX0 Polyhydramnios, third trimester, not applicable or unspecified: Secondary | ICD-10-CM

## 2022-08-10 DIAGNOSIS — O0990 Supervision of high risk pregnancy, unspecified, unspecified trimester: Secondary | ICD-10-CM | POA: Insufficient documentation

## 2022-08-10 DIAGNOSIS — O24410 Gestational diabetes mellitus in pregnancy, diet controlled: Secondary | ICD-10-CM

## 2022-08-10 DIAGNOSIS — Z3483 Encounter for supervision of other normal pregnancy, third trimester: Secondary | ICD-10-CM | POA: Insufficient documentation

## 2022-08-10 DIAGNOSIS — O4403 Placenta previa specified as without hemorrhage, third trimester: Secondary | ICD-10-CM

## 2022-08-10 DIAGNOSIS — O4400 Placenta previa specified as without hemorrhage, unspecified trimester: Secondary | ICD-10-CM

## 2022-08-10 NOTE — Progress Notes (Signed)
   PRENATAL VISIT NOTE  Subjective:  [name] is a 36 y.o. G3P1102 at [redacted]w[redacted]d being seen today for ongoing prenatal care.  She is currently monitored for the following issues for this high-risk pregnancy and has Alpha thalassemia silent carrier; Genetic carrier; Supervision of high risk pregnancy, antepartum; Velamentous insertion of umbilical cord, antepartum; Placenta previa antepartum; Diet controlled gestational diabetes mellitus; and Polyhydramnios on their problem list.  Patient reports no complaints.  Contractions: Irritability. Vag. Bleeding: None.  Movement: Present. Denies leaking of fluid.   The following portions of the patient's history were reviewed and updated as appropriate: allergies, current medications, past family history, past medical history, past social history, past surgical history and problem list.   Objective:   Vitals:   08/10/22 1502  BP: 118/78  Pulse: (!) 106  Weight: 280 lb (127 kg)    Fetal Status: Fetal Heart Rate (bpm): 152 Fundal Height: 42 cm Movement: Present     General:  Alert, oriented and cooperative. Patient is in no acute distress.  Skin: Skin is warm and dry. No rash noted.   Cardiovascular: Normal heart rate noted  Respiratory: Normal respiratory effort, no problems with respiration noted  Abdomen: Soft, gravid, appropriate for gestational age.  Pain/Pressure: Absent     Pelvic: Cervical exam performed in the presence of a chaperone Dilation: Closed Effacement (%): Thick Station: Ballotable  Extremities: Normal range of motion.     Mental Status: Normal mood and affect. Normal behavior. Normal judgment and thought content.   Assessment and Plan:  Pregnancy: G3P1102 at [redacted]w[redacted]d 1. Supervision of high risk pregnancy, antepartum Patient is doing well without complaints Cultures today  2. Diet controlled gestational diabetes mellitus (GDM) in third trimester Patient did not bring CBG log. She reports fasting in the low 90's and pp as  high as 125 Ultrasound per MFM Will plan for IOL at 39 weeks  3. Placenta previa antepartum Resolved on previous scan  4. Polyhydramnios in third trimester complication, single or unspecified fetus Normal AFI on 10/13  Term labor symptoms and general obstetric precautions including but not limited to vaginal bleeding, contractions, leaking of fluid and fetal movement were reviewed in detail with the patient. Please refer to After Visit Summary for other counseling recommendations.   Return in about 1 week (around 08/17/2022) for in person, ROB, High risk.  Future Appointments  Date Time Provider [place]  08/13/2022  7:15 AM [facility] NURSE [facility]  08/13/2022  7:30 AM [facility] US2 [facility]  08/18/2022  3:30 PM Truett Mainland, DO [facility] None  08/20/2022  7:15 AM [facility] NURSE [facility]  08/20/2022  7:30 AM [facility] US3 [facility] [place]    Mora Bellman, MD

## 2022-08-11 ENCOUNTER — Encounter: Payer: BC Managed Care – PPO | Admitting: Obstetrics and Gynecology

## 2022-08-11 LAB — GC/CHLAMYDIA PROBE AMP (~~LOC~~) NOT AT ARMC
Chlamydia: NEGATIVE
Comment: NEGATIVE
Comment: NORMAL
Neisseria Gonorrhea: NEGATIVE

## 2022-08-12 LAB — STREP GP B NAA: Strep Gp B NAA: NEGATIVE

## 2022-08-13 ENCOUNTER — Ambulatory Visit: Payer: BC Managed Care – PPO | Admitting: *Deleted

## 2022-08-13 ENCOUNTER — Ambulatory Visit: Payer: BC Managed Care – PPO | Attending: Obstetrics

## 2022-08-13 VITALS — BP 133/61 | HR 96

## 2022-08-13 DIAGNOSIS — D563 Thalassemia minor: Secondary | ICD-10-CM

## 2022-08-13 DIAGNOSIS — O99213 Obesity complicating pregnancy, third trimester: Secondary | ICD-10-CM | POA: Diagnosis present

## 2022-08-13 DIAGNOSIS — O285 Abnormal chromosomal and genetic finding on antenatal screening of mother: Secondary | ICD-10-CM | POA: Diagnosis not present

## 2022-08-13 DIAGNOSIS — O43123 Velamentous insertion of umbilical cord, third trimester: Secondary | ICD-10-CM | POA: Diagnosis not present

## 2022-08-13 DIAGNOSIS — O4400 Placenta previa specified as without hemorrhage, unspecified trimester: Secondary | ICD-10-CM

## 2022-08-13 DIAGNOSIS — O0990 Supervision of high risk pregnancy, unspecified, unspecified trimester: Secondary | ICD-10-CM | POA: Diagnosis present

## 2022-08-13 DIAGNOSIS — O09213 Supervision of pregnancy with history of pre-term labor, third trimester: Secondary | ICD-10-CM | POA: Diagnosis not present

## 2022-08-13 DIAGNOSIS — O43129 Velamentous insertion of umbilical cord, unspecified trimester: Secondary | ICD-10-CM | POA: Insufficient documentation

## 2022-08-13 DIAGNOSIS — O403XX0 Polyhydramnios, third trimester, not applicable or unspecified: Secondary | ICD-10-CM

## 2022-08-13 DIAGNOSIS — Z3A37 37 weeks gestation of pregnancy: Secondary | ICD-10-CM

## 2022-08-13 DIAGNOSIS — E669 Obesity, unspecified: Secondary | ICD-10-CM

## 2022-08-17 ENCOUNTER — Encounter (HOSPITAL_COMMUNITY): Payer: Self-pay

## 2022-08-17 ENCOUNTER — Encounter (HOSPITAL_COMMUNITY): Payer: Self-pay | Admitting: *Deleted

## 2022-08-17 ENCOUNTER — Telehealth (HOSPITAL_COMMUNITY): Payer: Self-pay | Admitting: *Deleted

## 2022-08-17 NOTE — Telephone Encounter (Signed)
Preadmission screen  

## 2022-08-18 ENCOUNTER — Encounter: Payer: BC Managed Care – PPO | Admitting: Obstetrics & Gynecology

## 2022-08-18 ENCOUNTER — Ambulatory Visit (INDEPENDENT_AMBULATORY_CARE_PROVIDER_SITE_OTHER): Payer: BC Managed Care – PPO | Admitting: Family Medicine

## 2022-08-18 VITALS — BP 124/77 | HR 109 | Wt 281.0 lb

## 2022-08-18 DIAGNOSIS — O0993 Supervision of high risk pregnancy, unspecified, third trimester: Secondary | ICD-10-CM

## 2022-08-18 DIAGNOSIS — O0990 Supervision of high risk pregnancy, unspecified, unspecified trimester: Secondary | ICD-10-CM

## 2022-08-18 DIAGNOSIS — Z3A38 38 weeks gestation of pregnancy: Secondary | ICD-10-CM

## 2022-08-18 DIAGNOSIS — O403XX0 Polyhydramnios, third trimester, not applicable or unspecified: Secondary | ICD-10-CM

## 2022-08-18 DIAGNOSIS — O24410 Gestational diabetes mellitus in pregnancy, diet controlled: Secondary | ICD-10-CM

## 2022-08-18 NOTE — Progress Notes (Signed)
   PRENATAL VISIT NOTE  Subjective:  [name] is a 36 y.o. G3P1102 at [redacted]w[redacted]d being seen today for ongoing prenatal care.  She is currently monitored for the following issues for this high-risk pregnancy and has Alpha thalassemia silent carrier; Genetic carrier; Supervision of high risk pregnancy, antepartum; Velamentous insertion of umbilical cord, antepartum; Placenta previa antepartum; Diet controlled gestational diabetes mellitus; and Polyhydramnios on their problem list.  Patient reports no complaints.  Contractions: Irritability. Vag. Bleeding: None.  Movement: Present. Denies leaking of fluid.   The following portions of the patient's history were reviewed and updated as appropriate: allergies, current medications, past family history, past medical history, past social history, past surgical history and problem list.   Objective:   Vitals:   08/18/22 1533  BP: 124/77  Pulse: (!) 109  Weight: 281 lb (127.5 kg)    Fetal Status: Fetal Heart Rate (bpm): 154   Movement: Present     General:  Alert, oriented and cooperative. Patient is in no acute distress.  Skin: Skin is warm and dry. No rash noted.   Cardiovascular: Normal heart rate noted  Respiratory: Normal respiratory effort, no problems with respiration noted  Abdomen: Soft, gravid, appropriate for gestational age.  Pain/Pressure: Absent     Pelvic: Cervical exam performed in the presence of a chaperone        Extremities: Normal range of motion.  Edema: Trace  Mental Status: Normal mood and affect. Normal behavior. Normal judgment and thought content.   Assessment and Plan:  Pregnancy: G3P1102 at [redacted]w[redacted]d 1. Supervision of high risk pregnancy, antepartum FHT and FH normal Scheduled for induction at 39 weeks. Likely foley balloon and cytotec  2. Diet controlled gestational diabetes mellitus (GDM) in third trimester CBGs controlled BPP this week  3. Polyhydramnios in third trimester complication, single or  unspecified fetus Resolved.  Term labor symptoms and general obstetric precautions including but not limited to vaginal bleeding, contractions, leaking of fluid and fetal movement were reviewed in detail with the patient. Please refer to After Visit Summary for other counseling recommendations.   No follow-ups on file.  Future Appointments  Date Time Provider [place]  08/20/2022  7:15 AM [facility] NURSE [facility]  08/20/2022  7:30 AM [facility] US3 [facility]  08/24/2022  7:30 AM [facility] SCHED ROOM [facility] None    Truett Mainland, DO

## 2022-08-19 ENCOUNTER — Other Ambulatory Visit: Payer: Self-pay | Admitting: Advanced Practice Midwife

## 2022-08-20 ENCOUNTER — Ambulatory Visit: Payer: BC Managed Care – PPO | Admitting: *Deleted

## 2022-08-20 ENCOUNTER — Ambulatory Visit (HOSPITAL_BASED_OUTPATIENT_CLINIC_OR_DEPARTMENT_OTHER): Payer: BC Managed Care – PPO

## 2022-08-20 VITALS — BP 123/62 | HR 91

## 2022-08-20 DIAGNOSIS — O43123 Velamentous insertion of umbilical cord, third trimester: Secondary | ICD-10-CM

## 2022-08-20 DIAGNOSIS — Z3A38 38 weeks gestation of pregnancy: Secondary | ICD-10-CM

## 2022-08-20 DIAGNOSIS — O4400 Placenta previa specified as without hemorrhage, unspecified trimester: Secondary | ICD-10-CM | POA: Insufficient documentation

## 2022-08-20 DIAGNOSIS — O43129 Velamentous insertion of umbilical cord, unspecified trimester: Secondary | ICD-10-CM

## 2022-08-20 DIAGNOSIS — O403XX0 Polyhydramnios, third trimester, not applicable or unspecified: Secondary | ICD-10-CM

## 2022-08-20 DIAGNOSIS — O99213 Obesity complicating pregnancy, third trimester: Secondary | ICD-10-CM

## 2022-08-20 DIAGNOSIS — O0990 Supervision of high risk pregnancy, unspecified, unspecified trimester: Secondary | ICD-10-CM

## 2022-08-20 DIAGNOSIS — O09213 Supervision of pregnancy with history of pre-term labor, third trimester: Secondary | ICD-10-CM | POA: Diagnosis not present

## 2022-08-20 DIAGNOSIS — O24410 Gestational diabetes mellitus in pregnancy, diet controlled: Secondary | ICD-10-CM

## 2022-08-20 DIAGNOSIS — D563 Thalassemia minor: Secondary | ICD-10-CM

## 2022-08-20 DIAGNOSIS — O99013 Anemia complicating pregnancy, third trimester: Secondary | ICD-10-CM

## 2022-08-20 DIAGNOSIS — E669 Obesity, unspecified: Secondary | ICD-10-CM

## 2022-08-21 ENCOUNTER — Inpatient Hospital Stay (HOSPITAL_COMMUNITY)
Admission: AD | Admit: 2022-08-21 | Discharge: 2022-08-22 | DRG: 807 | Disposition: A | Discharge status: Home / Self Care | Payer: BC Managed Care – PPO | Attending: Obstetrics and Gynecology | Admitting: Obstetrics and Gynecology

## 2022-08-21 ENCOUNTER — Encounter (HOSPITAL_COMMUNITY): Payer: Self-pay | Admitting: Obstetrics and Gynecology

## 2022-08-21 DIAGNOSIS — O24420 Gestational diabetes mellitus in childbirth, diet controlled: Secondary | ICD-10-CM | POA: Diagnosis present

## 2022-08-21 DIAGNOSIS — O43123 Velamentous insertion of umbilical cord, third trimester: Secondary | ICD-10-CM | POA: Diagnosis present

## 2022-08-21 DIAGNOSIS — O24410 Gestational diabetes mellitus in pregnancy, diet controlled: Secondary | ICD-10-CM | POA: Diagnosis present

## 2022-08-21 DIAGNOSIS — O403XX0 Polyhydramnios, third trimester, not applicable or unspecified: Secondary | ICD-10-CM | POA: Diagnosis present

## 2022-08-21 DIAGNOSIS — O0990 Supervision of high risk pregnancy, unspecified, unspecified trimester: Secondary | ICD-10-CM

## 2022-08-21 DIAGNOSIS — O26893 Other specified pregnancy related conditions, third trimester: Secondary | ICD-10-CM | POA: Diagnosis present

## 2022-08-21 DIAGNOSIS — Z148 Genetic carrier of other disease: Secondary | ICD-10-CM | POA: Diagnosis not present

## 2022-08-21 DIAGNOSIS — Z3A38 38 weeks gestation of pregnancy: Secondary | ICD-10-CM

## 2022-08-21 DIAGNOSIS — O43129 Velamentous insertion of umbilical cord, unspecified trimester: Secondary | ICD-10-CM | POA: Diagnosis present

## 2022-08-21 LAB — CBC
HCT: 37.2 % (ref 36.0–46.0)
HCT: 36.5 % (ref 36.0–46.0)
Hemoglobin: 12.4 g/dL (ref 12.0–15.0)
Hemoglobin: 12.2 g/dL (ref 12.0–15.0)
MCH: 27.4 pg (ref 26.0–34.0)
MCH: 27 pg (ref 26.0–34.0)
MCHC: 33.3 g/dL (ref 30.0–36.0)
MCHC: 33.4 g/dL (ref 30.0–36.0)
MCV: 82.1 fL (ref 80.0–100.0)
MCV: 80.8 fL (ref 80.0–100.0)
Platelets: 176 10*3/uL (ref 150–400)
Platelets: 207 10*3/uL (ref 150–400)
RBC: 4.53 MIL/uL (ref 3.87–5.11)
RBC: 4.52 MIL/uL (ref 3.87–5.11)
RDW: 13.3 % (ref 11.5–15.5)
RDW: 13.3 % (ref 11.5–15.5)
WBC: 12 10*3/uL — ABNORMAL HIGH (ref 4.0–10.5)
WBC: 11.7 10*3/uL — ABNORMAL HIGH (ref 4.0–10.5)
nRBC: 0 % (ref 0.0–0.2)
nRBC: 0 % (ref 0.0–0.2)

## 2022-08-21 LAB — TYPE AND SCREEN
ABO/RH(D): A POS
Antibody Screen: NEGATIVE

## 2022-08-21 LAB — RPR: RPR Ser Ql: NONREACTIVE

## 2022-08-21 MED ORDER — ONDANSETRON HCL 4 MG/2ML IJ SOLN: 4.0000 mg | Freq: Four times a day (QID) | INTRAMUSCULAR | Status: DC | PRN

## 2022-08-21 MED ORDER — OXYTOCIN 10 UNIT/ML IJ SOLN
10.0000 [IU] | Freq: Once | INTRAMUSCULAR | Status: AC
  Administered 2022-08-21: 10 [IU] via INTRAMUSCULAR

## 2022-08-21 MED ORDER — TETANUS-DIPHTH-ACELL PERTUSSIS 5-2.5-18.5 LF-MCG/0.5 IM SUSY: 0.5000 mL | PREFILLED_SYRINGE | Freq: Once | INTRAMUSCULAR | Status: DC

## 2022-08-21 MED ORDER — PRENATAL MULTIVITAMIN CH
1.0000 | ORAL_TABLET | Freq: Every day | ORAL | Status: DC
  Administered 2022-08-21 – 2022-08-22 (×2): 1 via ORAL
  Filled 2022-08-21 (×2): qty 1

## 2022-08-21 MED ORDER — DIBUCAINE (PERIANAL) 1 % EX OINT: 1.0000 | TOPICAL_OINTMENT | CUTANEOUS | Status: DC | PRN

## 2022-08-21 MED ORDER — SENNOSIDES-DOCUSATE SODIUM 8.6-50 MG PO TABS
2.0000 | ORAL_TABLET | ORAL | Status: DC
  Administered 2022-08-21 – 2022-08-22 (×2): 2 via ORAL
  Filled 2022-08-21 (×2): qty 2

## 2022-08-21 MED ORDER — SODIUM CHLORIDE 0.9 % IV SOLN: INTRAVENOUS | Status: DC | PRN

## 2022-08-21 MED ORDER — OXYCODONE HCL 5 MG PO TABS: 5.0000 mg | ORAL_TABLET | ORAL | Status: DC | PRN

## 2022-08-21 MED ORDER — BENZOCAINE-MENTHOL 20-0.5 % EX AERO: 1.0000 | INHALATION_SPRAY | CUTANEOUS | Status: DC | PRN

## 2022-08-21 MED ORDER — ACETAMINOPHEN 325 MG PO TABS
650.0000 mg | ORAL_TABLET | ORAL | Status: DC | PRN
  Administered 2022-08-22: 650 mg via ORAL
  Filled 2022-08-21: qty 2

## 2022-08-21 MED ORDER — SOD CITRATE-CITRIC ACID 500-334 MG/5ML PO SOLN: 30.0000 mL | ORAL | Status: DC | PRN

## 2022-08-21 MED ORDER — WITCH HAZEL-GLYCERIN EX PADS: 1.0000 | MEDICATED_PAD | CUTANEOUS | Status: DC | PRN

## 2022-08-21 MED ORDER — ONDANSETRON HCL 4 MG PO TABS: 4.0000 mg | ORAL_TABLET | ORAL | Status: DC | PRN

## 2022-08-21 MED ORDER — DIPHENHYDRAMINE HCL 25 MG PO CAPS: 25.0000 mg | ORAL_CAPSULE | Freq: Four times a day (QID) | ORAL | Status: DC | PRN

## 2022-08-21 MED ORDER — ONDANSETRON HCL 4 MG/2ML IJ SOLN: 4.0000 mg | INTRAMUSCULAR | Status: DC | PRN

## 2022-08-21 MED ORDER — SODIUM CHLORIDE 0.9% FLUSH: 3.0000 mL | Freq: Two times a day (BID) | INTRAVENOUS | Status: DC

## 2022-08-21 MED ORDER — COCONUT OIL OIL
1.0000 | TOPICAL_OIL | Status: DC | PRN
  Administered 2022-08-21: 1 via TOPICAL

## 2022-08-21 MED ORDER — LIDOCAINE HCL (PF) 1 % IJ SOLN: 30.0000 mL | INTRAMUSCULAR | Status: DC | PRN

## 2022-08-21 MED ORDER — OXYCODONE HCL 5 MG PO TABS
10.0000 mg | ORAL_TABLET | ORAL | Status: DC | PRN
  Administered 2022-08-21: 10 mg via ORAL
  Filled 2022-08-21: qty 2

## 2022-08-21 MED ORDER — OXYCODONE-ACETAMINOPHEN 5-325 MG PO TABS
1.0000 | ORAL_TABLET | ORAL | Status: DC | PRN
  Administered 2022-08-21: 1 via ORAL
  Filled 2022-08-21: qty 1

## 2022-08-21 MED ORDER — OXYTOCIN BOLUS FROM INFUSION: 333.0000 mL | Freq: Once | INTRAVENOUS | Status: DC

## 2022-08-21 MED ORDER — ACETAMINOPHEN 325 MG PO TABS: 650.0000 mg | ORAL_TABLET | ORAL | Status: DC | PRN

## 2022-08-21 MED ORDER — ZOLPIDEM TARTRATE 5 MG PO TABS: 5.0000 mg | ORAL_TABLET | Freq: Every evening | ORAL | Status: DC | PRN

## 2022-08-21 MED ORDER — LACTATED RINGERS IV SOLN: INTRAVENOUS | Status: DC

## 2022-08-21 MED ORDER — SIMETHICONE 80 MG PO CHEW: 80.0000 mg | CHEWABLE_TABLET | ORAL | Status: DC | PRN

## 2022-08-21 MED ORDER — SODIUM CHLORIDE 0.9% FLUSH: 3.0000 mL | INTRAVENOUS | Status: DC | PRN

## 2022-08-21 MED ORDER — OXYTOCIN 10 UNIT/ML IJ SOLN
INTRAMUSCULAR | Status: AC
  Filled 2022-08-21: qty 1

## 2022-08-21 MED ORDER — OXYTOCIN-SODIUM CHLORIDE 30-0.9 UT/500ML-% IV SOLN: 2.5000 [IU]/h | INTRAVENOUS | Status: DC

## 2022-08-21 MED ORDER — IBUPROFEN 600 MG PO TABS
600.0000 mg | ORAL_TABLET | Freq: Four times a day (QID) | ORAL | Status: DC
  Administered 2022-08-21 – 2022-08-22 (×6): 600 mg via ORAL
  Filled 2022-08-21 (×6): qty 1

## 2022-08-21 MED ORDER — LACTATED RINGERS IV SOLN: 500.0000 mL | INTRAVENOUS | Status: DC | PRN

## 2022-08-21 MED ORDER — OXYCODONE-ACETAMINOPHEN 5-325 MG PO TABS: 2.0000 | ORAL_TABLET | ORAL | Status: DC | PRN

## 2022-08-21 NOTE — Lactation Note (Signed)
This note was copied from a baby's chart. Lactation Consultation Note  Patient Name: Boy Timberlynn Kizziah VHQIO'N Date: 08/21/2022 Age : 34 hours old  Reason for consult: Follow-up assessment (0835 blood glucose 65) See doc flow sheets for attempt to feed      Maternal Data Has patient been taught Hand Expression?: Yes Does the patient have breastfeeding experience prior to this delivery?: Yes How long did the patient breastfeed?: per mom 1st baby DL , and ended up pumping x 8 months ,  Feeding Mother's Current Feeding Choice: Breast Milk and Formula  LATCH Score - baby only took a few sucks and did not sustain the latch, sleepy , baby STS.  Latch: Repeated attempts needed to sustain latch, nipple held in mouth throughout feeding, stimulation needed to elicit sucking reflex.  Audible Swallowing: None  Type of Nipple: Everted at rest and after stimulation (Areola edema)  Comfort (Breast/Nipple): Soft / non-tender  Hold (Positioning): Assistance needed to correctly position infant at breast and maintain latch.  LATCH Score: 6   Lactation Tools Discussed/Used Tools: Pump;Flanges (mom already had the hand pump) Flange Size: 24;27;Other (comment) (per mom the #24 F was snug, LC checked after reverse pressure and the #24 F fit well. #27 F provided if needed) Breast pump type: Manual  Interventions Interventions: Breast feeding basics reviewed;Education;LC Services brochure  Discharge Pump: DEBP;Manual;Hands Free;Personal  Consult Status Consult Status: Follow-up Date: 08/21/22 Follow-up type: In-patient    [place] 08/21/2022, 10:07 AM

## 2022-08-21 NOTE — Lactation Note (Signed)
This note was copied from a baby's chart. Lactation Consultation Note  Patient Name: Boy Chalisa Kobler KYHCW'C Date: 08/21/2022 Reason for consult: Initial assessment;Early term 37-38.6wks;Maternal endocrine disorder (dad holding baby, and mom eating breakfast. Last blood sugar WNL. per mom blood sugar was drawn at 0835, pending. Mom eating breakfast, LC will F/U.) Age:34 hours  Maternal Data Does the patient have breastfeeding experience prior to this delivery?: Yes How long did the patient breastfeed?: per mom 1st baby DL , and ended up pumping x 8 months ,  Feeding Mother's Current Feeding Choice: Breast Milk and Formula (due to low blood sugar)  LATCH Score ( Latch score by RN )  Latch: Repeated attempts needed to sustain latch, nipple held in mouth throughout feeding, stimulation needed to elicit sucking reflex.  Audible Swallowing: None  Type of Nipple: Flat  Comfort (Breast/Nipple): Soft / non-tender  Hold (Positioning): Assistance needed to correctly position infant at breast and maintain latch.  LATCH Score: 5   Lactation Tools Discussed/Used    Interventions  Education   Discharge Pump: DEBP;Hands Free;Personal (per mom a Motiff , and a Spectra)  Consult Status Consult Status: Follow-up Date: 08/21/22 Follow-up type: In-patient    [place]res 08/21/2022, 8:45 AM

## 2022-08-21 NOTE — MAU Note (Signed)
..  [name] is a 36 y.o. at [redacted]w[redacted]d here in MAU reporting: contractions and leaking of fluid since 10pm.  +FM. FHR 135 9.5cm by Victorino Dike, RN Elvera Lennox, MD called for admission orders.  Labor and delivery charge nurse called for room assignment.  Pt transported to LD accompanied by RN and Laury Deep, CNM.

## 2022-08-21 NOTE — Discharge Summary (Shared)
Postpartum Discharge Summary  Date of Service updated***     Patient Name: [name] DOB: 28-Dec-1985 MRN: [id number]  Date of admission: 08/21/2022 Delivery date:08/21/2022  Delivering provider: Stormy Card  Date of discharge: 08/21/2022  Admitting diagnosis: Encounter for induction of labor [Z34.90] Intrauterine pregnancy: [redacted]w[redacted]d    Secondary diagnosis:  Principal Problem:   SVD (spontaneous vaginal delivery) Active Problems:   Velamentous insertion of umbilical cord, antepartum   Diet controlled gestational diabetes mellitus  Additional problems: ***    Discharge diagnosis: Term Pregnancy Delivered and GDM A1                                              Post partum procedures:{Postpartum procedures:23558} Augmentation: N/A Complications: None  Hospital course: Onset of Labor With Vaginal Delivery      36y.o. yo [id number] admitted in Active Labor on 08/21/2022. Labor course was complicated by none. She arrived on the unit almost fully dilated and progressed to a precipitous delivery which was  uncomplicated. Membrane Rupture Time/Date: 10:00 PM ,08/21/2022   Delivery Method:Vaginal, Spontaneous  Episiotomy: None  Lacerations:  None  Patient had a postpartum course complicated by ***.  She is ambulating, tolerating a regular diet, passing flatus, and urinating well. Patient is discharged home in stable condition on 08/21/22.  Newborn Data: Birth date:08/21/2022  Birth time:12:23 AM  Gender:Female  Living status:Living  Apgars:9 ,9  Weight:3590 g   Magnesium Sulfate received: No BMZ received: No Rhophylac:{Rhophylac received:30440032} MMR:{MMR:30440033} T-DaP:{Tdap:23962} Flu: {F{TWS:56812}ransfusion:{Transfusion received:30440034}  Physical exam  Vitals:   08/21/22 0046 08/21/22 0108 08/21/22 0131 08/21/22 0146  BP: (!) 148/81 (!) 148/72 131/73 121/81  Pulse: 90 89 85 89  Weight:      Height:       General: {Exam;  general:21111117} Lochia: {Desc; appropriate/inappropriate:30686::"appropriate"} Uterine Fundus: {Desc; firm/soft:30687} Incision: {Exam; incision:21111123} DVT Evaluation: {Exam; dvt:2111122} Labs: Lab Results  Component Value Date   WBC 12.0 (H) 08/21/2022   HGB 12.4 08/21/2022   HCT 37.2 08/21/2022   MCV 82.1 08/21/2022   PLT 176 08/21/2022      Latest Ref Rng & Units 02/27/2021   11:02 AM  CMP  Glucose 70 - 99 mg/dL 103   BUN 6 - 20 mg/dL 9   Creatinine 0.44 - 1.00 mg/dL 0.70   Sodium 135 - 145 mmol/L 138   Potassium 3.5 - 5.1 mmol/L 4.0   Chloride 98 - 111 mmol/L 106   CO2 22 - 32 mmol/L 28   Calcium 8.9 - 10.3 mg/dL 8.9   Total Protein 6.5 - 8.1 g/dL 6.5   Total Bilirubin 0.3 - 1.2 mg/dL 0.7   Alkaline Phos 38 - 126 U/L 58   AST 15 - 41 U/L 21   ALT 0 - 44 U/L 35    Edinburgh Score:    03/13/2021    9:56 AM  Edinburgh Postnatal Depression Scale Screening Tool  I have been able to laugh and see the funny side of things. 0  I have looked forward with enjoyment to things. 0  I have blamed myself unnecessarily when things went wrong. 0  I have been anxious or worried for no good reason. 0  I have felt scared or panicky for no good reason. 0  Things have been getting on top of me. 1  I have been so unhappy that I have had difficulty sleeping. 0  I have felt sad or miserable. 0  I have been so unhappy that I have been crying. 0  The thought of harming myself has occurred to me. 0  Edinburgh Postnatal Depression Scale Total 1     After visit meds:  Allergies as of 08/21/2022   No Known Allergies   Med Rec must be completed prior to using this [facility]***        Discharge home in stable condition Infant Feeding: {Baby feeding:23562} Infant Disposition:{CHL IP OB HOME WITH PYKDXI:33825} Discharge instruction: per After Visit Summary and Postpartum booklet. Activity: Advance as tolerated. Pelvic rest for 6 weeks.  Diet: {OB KNLZ:76734193} Future  Appointments: Future Appointments  Date Time Provider [place]  08/24/2022  7:30 AM [facility] SCHED ROOM [facility] None   Follow up Visit:  The following message was sent to [facility] by Mikki Santee, MD  Please schedule this patient for an in person postpartum visit in 4-6 weeks with the following provider: Any provider. Additional Postpartum F/U:2 hour GTT  High risk pregnancy complicated by: GDM Delivery mode:  Vaginal, Spontaneous  Anticipated Birth Control:  Nexplanon   08/21/2022 Chiagoziem Sherrilyn Rist, MD

## 2022-08-22 MED ORDER — IBUPROFEN 600 MG PO TABS: 600.0000 mg | ORAL_TABLET | Freq: Four times a day (QID) | ORAL | 0 refills | Status: AC

## 2022-08-22 NOTE — Progress Notes (Signed)
Faculty Practice OB/GYN Attending Note  Patient desires circumcision for her female infant.  Circumcision procedure details discussed, risks and benefits of procedure were also discussed.  These include but are not limited to: Benefits of circumcision in men include reduction in the rates of urinary tract infection (UTI), penile cancer, some sexually transmitted infections, penile inflammatory and retractile disorders, as well as easier hygiene.  Risks include bleeding , infection, injury of glans which may lead to penile deformity or urinary tract issues, unsatisfactory cosmetic appearance and other potential complications related to the procedure.  It was emphasized that this is an elective procedure.   The patient reported that his older brother has some form of hypospadias.  Tried to examine the baby, unable to ascertain any anomaly from examination.  Penile size also noted to be small. Given this history, I discussed with Dr. Clydene Laming (pediatrician) and the parents about my discomfort in doing this procedure. Outpatient referral to Urologist recommended instead. They agreed with this plan.    Verita Schneiders, MD, [place] for [employer], [place]

## 2022-08-22 NOTE — Lactation Note (Signed)
This note was copied from a baby's chart. Lactation Consultation Note  Patient Name: [name] [id number] Date: 08/22/2022 Age : 34 hours  Reason for consult: Follow-up assessment;Early term 37-38.6wks;Infant weight loss (4 % weight loss,per mom baby has been feeding better from the bottle and has pumped x 3 since the DEBP was et up. LC encouraged mom to increase her pumping up to 8 times a day if the baby isn't latching.) Once the breast are feeling heavier , may need to give the baby and appetizer of EBM or formula from the bottle and then Latch the baby.  LC reviewed BF D/C teaching and S/S of mastitis , and LC resources Since the baby has not been latching, LC recommended coming back for [facility] O/P appt and mom receptive. Mom aware she will receive a call from [facility] O/P appt to set up the appt.   Maternal Data  Per mom milk came in with her other babies and she also experienced mastitis.   Feeding Mother's Current Feeding Choice: Breast Milk and Formula  LATCH Score   Lactation Tools Discussed/Used Tools: Pump Flange Size: 24;27 Breast pump type: Double-Electric Breast Pump;Manual Pump Education: Milk Storage;Other (comment) (DEBP was already set up)  Interventions    Discharge Discharge Education: Engorgement and breast care;Warning signs for feeding baby;Outpatient recommendation;Outpatient Epic message sent;Other (comment) (mom aware she will receive a LC O/P call next week to set up the appt.) Pump: DEBP;Manual;Hands Free;Personal  Consult Status Consult Status: Complete Date: 08/22/22    Myer Haff 08/22/2022, 12:43 PM

## 2022-08-22 NOTE — H&P (Signed)
OBSTETRIC ADMISSION HISTORY AND PHYSICAL  [name] is a 36 y.o. female [phone] with IUP at 35w4dby 11wk presenting with contractions and SROM. She came into MAU fully dilated and proceeded to delivery precipitously. She reports +FMs, No LOF, no VB, no blurry vision, headaches or peripheral edema, and RUQ pain.  She plans on breast feeding. She request nexplanon for birth control. [place]: By 11wk [place]--->  Estimated Date of Delivery: 08/31/22  Sono:    _0 , CWD, normal anatomy, cephalic presentation, 26734L  Prenatal History/Complications:  Diet controlled GDM History of retained placenta   Nursing Staff Provider  Office Location  [facility] Dating    [facility] Traditional [ ] Centering [ ] Mom-Baby Dyad    Language  English Anatomy [place]   Flu Vaccine  Declines Genetic/Carrier Screen  NIPS:    AFP:   neg [place]:  TDaP Vaccine   Declines Hgb A1C or  GTT Early : A1c 5.4 Third trimester abnormal  COVID Vaccine Completed   LAB RESULTS   Rhogam   Blood Type A/Positive/-- (07/18 0906)   Baby Feeding Plan Breast Antibody Negative (07/18 0906)  Contraception Nexplanon Rubella 2.62 (07/18 0906)  Circumcision Yes if a boy RPR Non Reactive (08/15 1014)   [facility] Pediatrics[facility]BsAg Negative (05/15 1655)   Support Person Husband HCVAb Non reactive  Prenatal Classes  HIV Non Reactive (08/15 1014)     BTL Consent  GBS Negative/-- (10/16 1542)neg (For PCN allergy, check sensitivities)   VBAC Consent  Pap Negative 07/2020       DME Rx [Valu.Nieves] BP cuff [Valu.Nieves] Weight Scale [place].Quinones [ ] Class [ ] Consent [ ] CNM visit  PHQ9 & GAD7 [Valu.Nieves ] new OB [x ] 28 weeks  [X] 36 weeks Induction  [ ] Orders Entered [ ]Foley Y/N    Past Medical History: Past Medical History:  Diagnosis Date   Diet controlled gestational diabetes mellitus 06/23/2022   Gestational diabetes    Gestational hypertension 02/15/2021   Migraine     Retained placenta w/o hemorrhage, delivered, current hospitalization 02/13/2021    Past Surgical History: Past Surgical History:  Procedure Laterality Date   DILATION AND EVACUATION N/A 02/13/2021   Procedure: DILATATION AND EVACUATION;  Surgeon: PDonnamae Jude MD;  Location: [facility] LD ORS;  Service: Gynecology;  Laterality: N/A;   WISDOM TOOTH EXTRACTION      Obstetrical History: OB History     Gravida  3   Para  3   Term  2   Preterm  1   AB  0   Living  3      SAB  0   IAB  0   Ectopic  0   Multiple  0   Live Births  3           Social History Social History   Socioeconomic History   Marital status: Single    Spouse name: Not on file   Number of children: Not on file   Years of education: 12   Highest education level: Not on file  Occupational History   Not on file  Tobacco Use   Smoking status: Never   Smokeless tobacco: Never  Vaping Use   Vaping Use: Never used  Substance and Sexual Activity   Alcohol use: Not Currently    Comment: Last drink early Feb 2023  Drug use: No   Sexual activity: Yes    Partners: Male    Birth control/protection: None  Other Topics Concern   Not on file  Social History Narrative   Not on file   Social Determinants of Health   Financial Resource Strain: Not on file  Food Insecurity: No Food Insecurity (08/21/2022)   Hunger Vital Sign    Worried About Running Out of Food in the Last Year: Never true    Ran Out of Food in the Last Year: Never true  Transportation Needs: No Transportation Needs (08/21/2022)   PRAPARE - Transportation    Lack of Transportation (Medical): No    Lack of Transportation (Non-Medical): No  Physical Activity: Not on file  Stress: Not on file  Social Connections: Not on file    Family History: Family History  Problem Relation Age of Onset   Cancer Mother    Breast cancer Mother    Heart disease Father    Cancer Maternal Grandmother    Breast cancer Maternal Grandmother     Asthma Neg Hx    Diabetes Neg Hx    Hypertension Neg Hx    Stroke Neg Hx     Allergies: No Known Allergies  Medications Prior to Admission  Medication Sig Dispense Refill Last Dose   Accu-Chek Softclix Lancets lancets 100 each by Other route 4 (four) times daily. (Patient not taking: Reported on 08/10/2022) 100 each 12 More than a month   amoxicillin (AMOXIL) 500 MG capsule Take 500 mg by mouth 3 (three) times daily.   More than a month   Blood Glucose Monitoring Suppl (ACCU-CHEK AVIVA PLUS) w/Device KIT 1 kit by Does not apply route daily. 1 kit 0 More than a month   Blood Pressure Monitoring (BLOOD PRESSURE KIT) KIT 1 kit by Does not apply route once a week. 1 kit 0 More than a month   glucose blood test strip Use as instructed 100 each 12 More than a month   magnesium oxide (MAG-OX) 400 (240 Mg) MG tablet Take 400 mg by mouth daily.   More than a month   Prenatal MV-Min-FA-Omega-3 (PRENATAL GUMMIES/DHA & FA) 0.4-32.5 MG CHEW Chew by mouth.   More than a month    Review of Systems   All systems reviewed and negative except as stated in HPI  Blood pressure 128/68, pulse 82, temperature 98 F (36.7 C), temperature source Oral, resp. rate 16, height 5' 5" (1.651 m), weight 127 kg, last menstrual period 11/30/2021, SpO2 100 %, unknown if currently breastfeeding.  General appearance: alert, cooperative, and appears stated age. Very uncomfortable due to contractions. Lungs: clear to auscultation bilaterally Heart: regular rate and rhythm Abdomen: soft, non-tender; bowel sounds normal Pelvic: see below Extremities: Homans sign is negative, no sign of DVT  Presentation: cephalic  Fetal monitoring: 135bpm, moderate variability, +accels, no decels.  Uterine activity: painful contractions every 2-3 mins  Dilation: 10 Effacement (%): 100 Exam by:: Rolitta Dawson CNM   Prenatal labs: ABO, Rh: --/--/A POS (10/27 0120) Antibody: NEG (10/27 0120) Rubella: 2.62 (07/18 0906) RPR: NON  REACTIVE (10/27 0120)  HBsAg: Negative (05/15 1655)  HIV: Non Reactive (08/15 1014)  GBS: Negative/-- (10/16 1542)  2 hr Glucola: abnormal Genetic screening  low risk Anatomy US: normal  Prenatal Transfer Tool  Maternal Diabetes: Yes:  Diabetes Type:  Diet controlled Genetic Screening: Normal Maternal Ultrasounds/Referrals:  initial vasa/placenta previa which resolved Fetal Ultrasounds or other Referrals:  None Maternal Substance Abuse:  No   Significant Maternal Medications:  None Significant Maternal Lab Results:  Group B Strep negative Number of Prenatal Visits:greater than 3 verified prenatal visits Other Comments:  None  No results found for this or any previous visit (from the past 24 hour(s)).  Patient Active Problem List   Diagnosis Date Noted   SVD (spontaneous vaginal delivery) 08/21/2022   Polyhydramnios 07/07/2022   Diet controlled gestational diabetes mellitus 06/23/2022   Velamentous insertion of umbilical cord, antepartum 05/12/2022   Placenta previa antepartum 05/12/2022   Supervision of high risk pregnancy, antepartum 02/11/2022   Genetic carrier 02/13/2021   Alpha thalassemia silent carrier 10/11/2020    Assessment/Plan:  [name] is a 36 y.o. [id number] at 78w4dhere for labor management.  She came in with full cervical dilation and an urge to push.  She had an uncomplicated vaginal delivery within 10 mins of arrival on the L&D.  She will continue with recovery and be transferred to post partum subsequently.  CLiliane ChannelMD MPH OB Fellow, [place]10/30/2023

## 2022-08-24 ENCOUNTER — Inpatient Hospital Stay (HOSPITAL_COMMUNITY): Payer: BC Managed Care – PPO

## 2022-08-24 ENCOUNTER — Inpatient Hospital Stay (HOSPITAL_COMMUNITY)
Admission: AD | Admit: 2022-08-24 | Payer: BC Managed Care – PPO | Source: Home / Self Care | Admitting: Obstetrics and Gynecology

## 2022-08-25 ENCOUNTER — Encounter: Payer: BC Managed Care – PPO | Admitting: Obstetrics & Gynecology

## 2022-08-27 ENCOUNTER — Other Ambulatory Visit: Payer: Self-pay | Admitting: *Deleted

## 2022-09-02 ENCOUNTER — Telehealth (HOSPITAL_COMMUNITY): Payer: Self-pay | Admitting: *Deleted

## 2022-09-02 NOTE — Telephone Encounter (Signed)
Left phone voicemail message.  Duffy Rhody, RN 09-02-2022 at 2:06pm

## 2022-10-06 ENCOUNTER — Other Ambulatory Visit: Payer: BC Managed Care – PPO

## 2022-10-06 ENCOUNTER — Ambulatory Visit (INDEPENDENT_AMBULATORY_CARE_PROVIDER_SITE_OTHER): Payer: BC Managed Care – PPO | Admitting: Licensed Clinical Social Worker

## 2022-10-06 ENCOUNTER — Ambulatory Visit (INDEPENDENT_AMBULATORY_CARE_PROVIDER_SITE_OTHER): Payer: BC Managed Care – PPO | Admitting: Obstetrics & Gynecology

## 2022-10-06 VITALS — BP 136/86 | HR 88 | Wt 265.0 lb

## 2022-10-06 DIAGNOSIS — F4323 Adjustment disorder with mixed anxiety and depressed mood: Secondary | ICD-10-CM

## 2022-10-06 DIAGNOSIS — Z8632 Personal history of gestational diabetes: Secondary | ICD-10-CM | POA: Diagnosis not present

## 2022-10-06 DIAGNOSIS — Z30017 Encounter for initial prescription of implantable subdermal contraceptive: Secondary | ICD-10-CM

## 2022-10-06 MED ORDER — ETONOGESTREL 68 MG ~~LOC~~ IMPL
68.0000 mg | DRUG_IMPLANT | Freq: Once | SUBCUTANEOUS | Status: AC
  Administered 2022-10-06: 68 mg via SUBCUTANEOUS

## 2022-10-06 NOTE — Progress Notes (Signed)
GYNECOLOGY OFFICE PROCEDURE NOTE  [name] is a 36 y.o. [phone] here for Nexplanon insertion.  Last pap smear was on 07/2020 and was normal.  No other gynecologic concerns.  Nexplanon Insertion Procedure Patient identified, informed consent performed, consent signed.   Patient does understand that irregular bleeding is a very common side effect of this medication. She was advised to have backup contraception for one week after placement. Pregnancy test in clinic today was negative.  Appropriate time out taken.  Patient's left arm was prepped and draped in the usual sterile fashion. The ruler used to measure and mark insertion area.  Patient was prepped with alcohol swab and then injected with 3 ml of 1% lidocaine.  She was prepped with betadine, Nexplanon removed from packaging,  Device confirmed in needle, then inserted full length of needle and withdrawn per handbook instructions. Nexplanon was able to palpated in the patient's arm; patient palpated the insert herself. There was minimal blood loss.  Patient insertion site covered with gauze and a pressure bandage to reduce any bruising.  The patient tolerated the procedure well and was given post procedure instructions.     Adam Phenix, MD Attending Obstetrician & Gynecologist, [facility] and Center for [facility]  10/06/2022

## 2022-10-06 NOTE — Addendum Note (Signed)
Addended by: Marya Landry D on: 10/06/2022 04:20 PM   Modules accepted: Orders

## 2022-10-06 NOTE — Progress Notes (Signed)
    Post Partum Visit Note  [name] is a 36 y.o. [phone] female who presents for a postpartum visit. She is 6 weeks postpartum following a normal spontaneous vaginal delivery.  I have fully reviewed the prenatal and intrapartum course. The delivery was at 38.4 gestational weeks.  Anesthesia: none. Postpartum course has been good. Baby is doing well. Baby is feeding by bottle - Enfamil Gentle . Bleeding no bleeding. Bowel function is normal. Pt feels she has increase in gas, causing pain. Bladder function is normal. Patient is not sexually active. Contraception method is abstinence. Interested in [place].  Postpartum depression screening: positive.   The pregnancy intention screening data noted above was reviewed. Potential methods of contraception were discussed. The patient elected to proceed with nexplanon, states she is abstinent since delivery    Health Maintenance Due  Topic Date Due   DTaP/Tdap/Td (1 - Tdap) Never done   INFLUENZA VACCINE  Never done   COVID-19 Vaccine (2 - 2023-24 season) 06/26/2022    The following portions of the patient's history were reviewed and updated as appropriate: allergies, current medications, past family history, past medical history, past social history, past surgical history, and problem list.  Review of Systems Pertinent items are noted in HPI.  Objective:  LMP 11/30/2021    General:  alert, cooperative, and no distress   Breasts:  not indicated  Lungs:   Heart:  regular rate and rhythm  Abdomen: soft, non-tender; bowel sounds normal; no masses,  no organomegaly   Wound   GU exam:  not indicated       Assessment:    1. History of gestational diabetes Orders Placed This Encounter  Procedures   Glucose tolerance, 2 hours      normal postpartum exam.   Plan:   Essential components of care per ACOG recommendations:  1.  Mood and well being: Patient with positive depression screening today. Reviewed local resources for  support.  - Patient tobacco use? No.   - hx of drug use? No.    2. Infant care and feeding:  -Patient currently breastmilk feeding? No.  -Social determinants of health (SDOH) reviewed in EPIC. No concerns  3. Sexuality, contraception and birth spacing - Patient does not want a pregnancy in the next year.  Desired family size is 3 children.  - Reviewed reproductive life planning. Reviewed contraceptive methods based on pt preferences and effectiveness.  Patient desired Hormonal Implant today.   - Discussed birth spacing of 18 months  4. Sleep and fatigue -Encouraged family/partner/community support of 4 hrs of uninterrupted sleep to help with mood and fatigue  5. Physical Recovery  - Discussed patients delivery and complications. She describes her labor as good. - Patient had a Vaginal, no problems at delivery. Patient had no laceration. Perineal healing reviewed. Patient expressed understanding - Patient has urinary incontinence? No. - Patient is safe to resume physical and sexual activity  6.  Health Maintenance - HM due items addressed Yes - Last pap smear  Diagnosis  Date Value Ref Range Status  08/08/2020   Final   - Negative for intraepithelial lesion or malignancy (NILM)   Pap smear not done at today's visit.  -Breast Cancer screening indicated? No.   7. Chronic Disease/Pregnancy Condition follow up: Gestational Diabetes  - PCP follow up  Adam Phenix, MD  Center for [facility], [facility]

## 2022-10-07 LAB — GLUCOSE TOLERANCE, 2 HOURS
Glucose, 2 hour: 128 mg/dL (ref 70–139)
Glucose, GTT - Fasting: 98 mg/dL (ref 70–99)

## 2022-10-07 NOTE — BH Specialist Note (Signed)
Integrated Behavioral Health Initial In-Person Visit  MRN: [id number] Name: [name]  Number of Integrated Behavioral Health Clinician visits: 1 Session Start time:   10:15am Session End time: 11:00am Total time in minutes: 45 mins in person at [facility]  Types of Service: General Behavioral Integrated Care (BHI)  Interpretor:No. Interpretor Name and Language: none   Warm Hand Off Completed.        Subjective: [name] is a 36 y.o. female accompanied by n/a Patient was referred by Mercy Riding CMA  for anxious mood and sleep deprivation. Patient reports the following symptoms/concerns: feeling overwhelmed, no extended family support, short interval between pregnancies, depressed mood and social isolation. Duration of problem: approx 2 months ; Severity of problem: mild  Objective: Mood: Good and Affect: Appropriate Risk of harm to self or others: No plan to harm self or others  Life Context: Family and Social: Lives with spouse in [place]  School/Work: employed  Self-Care: n/a Life Changes: Newborn and one year old   Patient and/or Family's Strengths/Protective Factors: Concrete supports in place (healthy food, safe environments, etc.)  Goals Addressed: Patient will: Reduce symptoms of: depression and stress Increase knowledge and/or ability of: coping skills  Demonstrate ability to: Increase healthy adjustment to current life circumstances  Progress towards Goals: Ongoing  Interventions: Interventions utilized: Motivational Interviewing and Supportive Counseling  Standardized Assessments completed: Not Needed  Patient and/or Family Response: Ms. Narayanan reports having no family in the area to provide additional support. Ms. Basques reports crying, difficulty sleeping, feeling overwhelmed and on edge. Ms. Walle denies thought of self harm.   Assessment: Patient currently experiencing adjustment disorder with mixed anxiety and depressed  mood.   Patient may benefit from integrated behavioral health.  Plan: Follow up with behavioral health clinician on : 2-3 weeks Behavioral recommendations: Delegate task with spouse to prevent burnout, prioritize rest, communicate need with trusted friend for added support Referral(s): Integrated [facility] [facility] (In Clinic) "From scale of 1-10, how likely are you to follow plan?":    Gwyndolyn Saxon, LCSW

## 2022-10-21 ENCOUNTER — Encounter: Payer: Self-pay | Admitting: Licensed Clinical Social Worker

## 2022-10-23 ENCOUNTER — Encounter: Payer: Self-pay | Admitting: Licensed Clinical Social Worker
